# Patient Record
Sex: Female | Born: 1960 | Race: White | Hispanic: No | Marital: Married | Smoking: Former smoker
Health system: Southern US, Community
[De-identification: ages and names within clinical notes are randomized; demographics above are authoritative.]

## PROBLEM LIST (undated history)

## (undated) DIAGNOSIS — E119 Type 2 diabetes mellitus without complications: Secondary | ICD-10-CM

## (undated) DIAGNOSIS — I10 Essential (primary) hypertension: Secondary | ICD-10-CM

## (undated) DIAGNOSIS — Z950 Presence of cardiac pacemaker: Secondary | ICD-10-CM

## (undated) DIAGNOSIS — E785 Hyperlipidemia, unspecified: Secondary | ICD-10-CM

## (undated) DIAGNOSIS — I429 Cardiomyopathy, unspecified: Secondary | ICD-10-CM

## (undated) HISTORY — PX: GALLBLADDER SURGERY: SHX652

---

## 2011-11-25 ENCOUNTER — Ambulatory Visit: Payer: Self-pay | Admitting: Internal Medicine

## 2012-06-23 ENCOUNTER — Ambulatory Visit: Payer: Self-pay | Admitting: Family Medicine

## 2012-06-23 LAB — URINALYSIS, COMPLETE
Bilirubin,UR: NEGATIVE
Blood: NEGATIVE
Ketone: NEGATIVE
Leukocyte Esterase: NEGATIVE
Ph: 6 (ref 4.5–8.0)
Specific Gravity: 1.005 (ref 1.003–1.030)

## 2012-06-24 LAB — URINE CULTURE

## 2015-03-24 ENCOUNTER — Ambulatory Visit
Admission: EM | Admit: 2015-03-24 | Discharge: 2015-03-24 | Disposition: A | Discharge status: Home / Self Care | Payer: BLUE CROSS/BLUE SHIELD | Attending: Family Medicine | Admitting: Family Medicine

## 2015-03-24 ENCOUNTER — Encounter: Payer: Self-pay | Admitting: Emergency Medicine

## 2015-03-24 DIAGNOSIS — R05 Cough: Secondary | ICD-10-CM

## 2015-03-24 DIAGNOSIS — R059 Cough, unspecified: Secondary | ICD-10-CM

## 2015-03-24 DIAGNOSIS — J9801 Acute bronchospasm: Secondary | ICD-10-CM | POA: Diagnosis not present

## 2015-03-24 HISTORY — DX: Essential (primary) hypertension: I10

## 2015-03-24 HISTORY — DX: Hyperlipidemia, unspecified: E78.5

## 2015-03-24 HISTORY — DX: Type 2 diabetes mellitus without complications: E11.9

## 2015-03-24 MED ORDER — HYDROCOD POLST-CPM POLST ER 10-8 MG/5ML PO SUER: 5.0000 mL | Freq: Two times a day (BID) | ORAL | Status: DC

## 2015-03-24 MED ORDER — ALBUTEROL SULFATE HFA 108 (90 BASE) MCG/ACT IN AERS: 1.0000 | INHALATION_SPRAY | Freq: Four times a day (QID) | RESPIRATORY_TRACT | Status: DC | PRN

## 2015-03-24 NOTE — ED Notes (Addendum)
Cough for 1 month. Dry, no phlegm, Rib pain from coughing so much

## 2015-03-24 NOTE — ED Provider Notes (Signed)
CSN: [id number]     Arrival date & time 03/24/15  1304 History   First MD Initiated Contact with Patient 03/24/15 1343     Chief Complaint  Patient presents with  . Cough   (Consider location/radiation/quality/duration/timing/severity/associated sxs/prior Treatment) HPI Comments: 54 yo female with a 3 weeks h/o cough, mostly dry, non-productive. Initially cold symptoms with congestion, runny nose, sore throat which resolved. Denies fevers, chills, chest pains, shortness of breath. States at night has had some wheezing.   The history is provided by the patient.    Past Medical History  Diagnosis Date  . Diabetes mellitus without complication   . Hypertension   . Hyperlipemia    Past Surgical History  Procedure Laterality Date  . Gallbladder surgery    . Cesarean section     Family History  Problem Relation Age of Onset  . Diabetes Mother   . Hypertension Mother   . Diabetes Father   . Hypertension Father    History  Substance Use Topics  . Smoking status: Former Games developer  . Smokeless tobacco: Never Used  . Alcohol Use: Yes   OB History    No data available     Review of Systems  Allergies  Review of patient's allergies indicates no known allergies.  Home Medications   Prior to Admission medications   Medication Sig Start Date End Date Taking? Authorizing Provider  Alogliptin-Metformin HCl (KAZANO) 12.5-500 MG TABS Take 1 tablet by mouth 2 (two) times daily.   Yes Historical Provider, MD  irbesartan (AVAPRO) 150 MG tablet Take 150 mg by mouth daily.   Yes Historical Provider, MD  albuterol (PROVENTIL HFA;VENTOLIN HFA) 108 (90 BASE) MCG/ACT inhaler Inhale 1-2 puffs into the lungs every 6 (six) hours as needed for wheezing or shortness of breath. 03/24/15   Payton Mccallum, MD  chlorpheniramine-HYDROcodone (TUSSIONEX PENNKINETIC ER) 10-8 MG/5ML SUER Take 5 mLs by mouth 2 (two) times daily. 03/24/15   Payton Mccallum, MD   BP 155/81 mmHg  Pulse 97  Temp(Src) 97.3 F (36.3  C) (Oral)  Resp 16  Ht  (1.575 m)  Wt 160 lb (72.576 kg)  BMI 29.26 kg/m2  SpO2 100% Physical Exam  Constitutional: She appears well-developed and well-nourished. No distress.  HENT:  Head: Normocephalic.  Right Ear: Tympanic membrane, external ear and ear canal normal.  Left Ear: Tympanic membrane, external ear and ear canal normal.  Nose: Nose normal.  Mouth/Throat: Oropharynx is clear and moist and mucous membranes are normal.  Eyes: Conjunctivae and EOM are normal. Pupils are equal, round, and reactive to light. Right eye exhibits no discharge. Left eye exhibits no discharge. No scleral icterus.  Neck: Normal range of motion. Neck supple. No JVD present. No tracheal deviation present. No thyromegaly present.  Cardiovascular: Normal rate, regular rhythm, normal heart sounds and intact distal pulses.   No murmur heard. Pulmonary/Chest: Effort normal. No stridor. No respiratory distress. She has wheezes (few expiratory wheezes bilaterally). She has no rales.  Lymphadenopathy:    She has no cervical adenopathy.  Neurological: She is alert.  Skin: Skin is warm and dry. No rash noted. She is not diaphoretic.  Vitals reviewed.   ED Course  Procedures (including critical care time) Labs Review Labs Reviewed - No data to display  Imaging Review No results found.   MDM   1. Cough   2. Bronchospasm    Discharge Medication List as of 03/24/2015  2:05 PM    START taking these medications  Details  albuterol (PROVENTIL HFA;VENTOLIN HFA) 108 (90 BASE) MCG/ACT inhaler Inhale 1-2 puffs into the lungs every 6 (six) hours as needed for wheezing or shortness of breath., Starting 03/24/2015, Until Discontinued, Print    chlorpheniramine-HYDROcodone (TUSSIONEX PENNKINETIC ER) 10-8 MG/5ML SUER Take 5 mLs by mouth 2 (two) times daily., Starting 03/24/2015, Until Discontinued, Normal      Plan: 1. diagnosis reviewed with patient 2. rx as per orders; risks, benefits, potential side  effects reviewed with patient 3. Recommend supportive treatment with rest, increased fluids 4. F/u prn if symptoms worsen or don't improve    Payton Mccallum, MD 03/24/15 [phone]

## 2015-03-30 ENCOUNTER — Ambulatory Visit (INDEPENDENT_AMBULATORY_CARE_PROVIDER_SITE_OTHER): Payer: BLUE CROSS/BLUE SHIELD | Admitting: Internal Medicine

## 2015-03-30 ENCOUNTER — Encounter: Payer: Self-pay | Admitting: Internal Medicine

## 2015-03-30 VITALS — BP 130/81 | HR 102 | Temp 98.5°F | Resp 16 | Wt 160.4 lb

## 2015-03-30 DIAGNOSIS — A499 Bacterial infection, unspecified: Secondary | ICD-10-CM | POA: Diagnosis not present

## 2015-03-30 DIAGNOSIS — F419 Anxiety disorder, unspecified: Secondary | ICD-10-CM | POA: Insufficient documentation

## 2015-03-30 DIAGNOSIS — I10 Essential (primary) hypertension: Secondary | ICD-10-CM | POA: Insufficient documentation

## 2015-03-30 DIAGNOSIS — E118 Type 2 diabetes mellitus with unspecified complications: Secondary | ICD-10-CM | POA: Insufficient documentation

## 2015-03-30 DIAGNOSIS — B9689 Other specified bacterial agents as the cause of diseases classified elsewhere: Secondary | ICD-10-CM

## 2015-03-30 DIAGNOSIS — E785 Hyperlipidemia, unspecified: Secondary | ICD-10-CM

## 2015-03-30 DIAGNOSIS — E1169 Type 2 diabetes mellitus with other specified complication: Secondary | ICD-10-CM | POA: Insufficient documentation

## 2015-03-30 DIAGNOSIS — E119 Type 2 diabetes mellitus without complications: Secondary | ICD-10-CM | POA: Insufficient documentation

## 2015-03-30 DIAGNOSIS — J329 Chronic sinusitis, unspecified: Secondary | ICD-10-CM

## 2015-03-30 DIAGNOSIS — Z794 Long term (current) use of insulin: Secondary | ICD-10-CM

## 2015-03-30 MED ORDER — AMOXICILLIN 500 MG PO CAPS: 500.0000 mg | ORAL_CAPSULE | Freq: Three times a day (TID) | ORAL | Status: DC

## 2015-03-30 NOTE — Patient Instructions (Signed)
Get  Allegra 180 mg ( fexofenadine) or Claritin 10 mg ( loratidine) and take once a day. Delsym cough syrup is good over the counter

## 2015-03-30 NOTE — Progress Notes (Signed)
Date:  03/30/2015   Name:  Lori Brewer   DOB:  04-Apr-1961   MRN:  [id number]   Chief Complaint: Sinus pressure and Cough Cough This is a chronic problem. The current episode started 1 to 4 weeks ago. The problem has been unchanged. The problem occurs every few minutes. The cough is non-productive. Associated symptoms include ear congestion, headaches and nasal congestion. Pertinent negatives include no chills, fever, postnasal drip, sore throat, shortness of breath or wheezing. The symptoms are aggravated by exercise and lying down. Treatments tried: advil cold and sinus. The treatment provided mild relief.     Review of Systems:  Review of Systems  Constitutional: Negative for fever, chills and fatigue.  HENT: Negative for postnasal drip, sore throat, trouble swallowing and voice change.   Respiratory: Positive for cough. Negative for chest tightness, shortness of breath and wheezing.   Neurological: Positive for headaches. Negative for dizziness, syncope and light-headedness.    Patient Active Problem List   Diagnosis Date Noted  . Anxiety, mild 03/30/2015  . Controlled diabetes mellitus type II without complication 03/30/2015  . Essential (primary) hypertension 03/30/2015  . Combined fat and carbohydrate induced hyperlipemia 03/30/2015    Prior to Admission medications   Medication Sig Start Date End Date Taking? Authorizing Provider  albuterol (PROVENTIL HFA;VENTOLIN HFA) 108 (90 BASE) MCG/ACT inhaler Inhale 1-2 puffs into the lungs every 6 (six) hours as needed for wheezing or shortness of breath. 03/24/15  Yes Payton Mccallum, MD  Alogliptin-Metformin HCl (KAZANO) 12.5-500 MG TABS Take 1 tablet by mouth 2 (two) times daily.   Yes Historical Provider, MD  atorvastatin (LIPITOR) 20 MG tablet Take 1 tablet by mouth daily at 2 PM daily at 2 PM. 01/11/15  Yes Historical Provider, MD  chlorpheniramine-HYDROcodone (TUSSIONEX PENNKINETIC ER) 10-8 MG/5ML SUER Take 5 mLs by mouth 2 (two) times  daily. 03/24/15  Yes Payton Mccallum, MD  glucose blood test strip  06/26/14  Yes Historical Provider, MD  irbesartan (AVAPRO) 150 MG tablet Take 150 mg by mouth daily.   Yes Historical Provider, MD    No Known Allergies  Past Surgical History  Procedure Laterality Date  . Gallbladder surgery    . Cesarean section      History  Substance Use Topics  . Smoking status: Never Smoker   . Smokeless tobacco: Never Used  . Alcohol Use: 0.0 oz/week    0 Standard drinks or equivalent per week     Medication list has been reviewed and updated.  Physical Examination:  Physical Exam  Constitutional: She appears well-developed and well-nourished.  HENT:  Right Ear: Tympanic membrane and ear canal normal.  Left Ear: Tympanic membrane and ear canal normal.  Nose: Right sinus exhibits maxillary sinus tenderness. Left sinus exhibits maxillary sinus tenderness.  Mouth/Throat: Oropharynx is clear and moist.  Neck: Normal range of motion. Neck supple. No thyromegaly present.  Cardiovascular: Normal rate, regular rhythm and normal heart sounds.   Pulmonary/Chest: Effort normal and breath sounds normal. No respiratory distress. She has no decreased breath sounds. She has no wheezes.  Lymphadenopathy:    She has no cervical adenopathy.    BP 130/81 mmHg  Pulse 102  Temp(Src) 98.5 F (36.9 C) (Oral)  Resp 16  Wt 160 lb 6.4 oz (72.757 kg)  SpO2 99% Assessment and Plan: 1. Sinusitis, bacterial Begin Allegra or clartin for post nasal drainage Continue inhaler if needed - amoxicillin (AMOXIL) 500 MG capsule; Take 1 capsule (500 mg total) by mouth 3 (  three) times daily.  Dispense: 30 capsule; Refill: 0   Bari Edward, MD [facility]  03/30/2015

## 2015-04-02 ENCOUNTER — Encounter: Payer: Self-pay | Admitting: Internal Medicine

## 2015-04-02 ENCOUNTER — Telehealth: Payer: Self-pay

## 2015-04-02 NOTE — Telephone Encounter (Signed)
Patient informed to finish antibiotic and let us know how she feels then.dr

## 2015-04-05 ENCOUNTER — Encounter: Payer: Self-pay | Admitting: Internal Medicine

## 2015-04-09 ENCOUNTER — Encounter: Payer: Self-pay | Admitting: Internal Medicine

## 2015-04-16 ENCOUNTER — Ambulatory Visit: Payer: Self-pay | Admitting: Internal Medicine

## 2015-04-16 ENCOUNTER — Encounter: Payer: Self-pay | Admitting: Family Medicine

## 2015-04-16 ENCOUNTER — Ambulatory Visit (INDEPENDENT_AMBULATORY_CARE_PROVIDER_SITE_OTHER): Payer: BLUE CROSS/BLUE SHIELD | Admitting: Family Medicine

## 2015-04-16 VITALS — BP 140/100 | HR 72 | Ht 62.0 in | Wt 161.0 lb

## 2015-04-16 DIAGNOSIS — A499 Bacterial infection, unspecified: Secondary | ICD-10-CM

## 2015-04-16 DIAGNOSIS — E119 Type 2 diabetes mellitus without complications: Secondary | ICD-10-CM

## 2015-04-16 DIAGNOSIS — J329 Chronic sinusitis, unspecified: Secondary | ICD-10-CM | POA: Diagnosis not present

## 2015-04-16 DIAGNOSIS — B9689 Other specified bacterial agents as the cause of diseases classified elsewhere: Secondary | ICD-10-CM

## 2015-04-16 MED ORDER — METFORMIN HCL 500 MG PO TABS: 500.0000 mg | ORAL_TABLET | Freq: Two times a day (BID) | ORAL | Status: DC

## 2015-04-16 MED ORDER — AZITHROMYCIN 250 MG PO TABS: ORAL_TABLET | ORAL | Status: DC

## 2015-04-16 MED ORDER — GUAIFENESIN-CODEINE 100-10 MG/5ML PO SOLN: 5.0000 mL | Freq: Three times a day (TID) | ORAL | Status: DC | PRN

## 2015-04-16 NOTE — Progress Notes (Signed)
Name: Lori Brewer   MRN: [id number]    DOB: 1961-09-06   Date:04/16/2015       Progress Note  Subjective  Chief Complaint  Chief Complaint  Patient presents with  . Diabetes  . Sinusitis    completed course of Amoxil- 2 weeks ago    Diabetes She presents for her follow-up diabetic visit. She has type 2 diabetes mellitus. Her disease course has been fluctuating. There are no hypoglycemic associated symptoms. Pertinent negatives for hypoglycemia include no dizziness, headaches or nervousness/anxiousness. Pertinent negatives for diabetes include no blurred vision, no chest pain, no fatigue, no foot paresthesias, no foot ulcerations, no polydipsia, no polyphagia, no polyuria, no visual change, no weakness and no weight loss. There are no hypoglycemic complications. Symptoms are worsening. Pertinent negatives for diabetic complications include no autonomic neuropathy, CVA, heart disease, nephropathy, peripheral neuropathy, PVD or retinopathy. Risk factors for coronary artery disease include diabetes mellitus. Current diabetic treatment includes oral agent (monotherapy) and oral agent (dual therapy). She is compliant with treatment all of the time. Her home blood glucose trend is increasing steadily. Her breakfast blood glucose range is generally 180-200 mg/dl.  Sinusitis This is a recurrent problem. The current episode started more than 1 month ago. The problem has been gradually improving since onset. There has been no fever. She is experiencing no pain. Associated symptoms include chills, coughing, shortness of breath and sinus pressure. Pertinent negatives include no congestion, diaphoresis, ear pain, headaches, neck pain, sore throat or swollen glands.    No problem-specific assessment & plan notes found for this encounter.   Past Medical History  Diagnosis Date  . Diabetes mellitus without complication   . Hypertension   . Hyperlipemia     Past Surgical History  Procedure Laterality Date   . Gallbladder surgery    . Cesarean section      Family History  Problem Relation Age of Onset  . Diabetes Mother   . Hypertension Mother   . Diabetes Father   . Hypertension Father     History   Social History  . Marital Status: Married    Spouse Name: N/A  . Number of Children: N/A  . Years of Education: N/A   Occupational History  . Not on file.   Social History Main Topics  . Smoking status: Former Games developer  . Smokeless tobacco: Never Used  . Alcohol Use: 0.0 oz/week    0 Standard drinks or equivalent per week  . Drug Use: No  . Sexual Activity: Yes   Other Topics Concern  . Not on file   Social History Narrative    No Known Allergies   Review of Systems  Constitutional: Positive for chills. Negative for fever, weight loss, malaise/fatigue, diaphoresis and fatigue.  HENT: Positive for sinus pressure. Negative for congestion, ear discharge, ear pain and sore throat.   Eyes: Negative for blurred vision.  Respiratory: Positive for cough and shortness of breath. Negative for sputum production and wheezing.   Cardiovascular: Negative for chest pain, palpitations and leg swelling.  Gastrointestinal: Negative for heartburn, nausea, abdominal pain, diarrhea, constipation, blood in stool and melena.  Genitourinary: Negative for dysuria, urgency, frequency and hematuria.  Musculoskeletal: Negative for myalgias, back pain, joint pain and neck pain.  Skin: Negative for rash.  Neurological: Negative for dizziness, tingling, sensory change, focal weakness, weakness and headaches.  Endo/Heme/Allergies: Negative for environmental allergies, polydipsia and polyphagia. Does not bruise/bleed easily.  Psychiatric/Behavioral: Negative for depression and suicidal ideas. The patient is  not nervous/anxious and does not have insomnia.      Objective  Filed Vitals:   04/16/15 0759  BP: 140/100  Pulse: 72  Height:  (1.575 m)  Weight: 161 lb (73.029 kg)    Physical Exam   Constitutional: She is well-developed, well-nourished, and in no distress. No distress.  HENT:  Head: Normocephalic and atraumatic.  Right Ear: External ear normal.  Left Ear: External ear normal.  Nose: Nose normal.  Mouth/Throat: Oropharynx is clear and moist.  Eyes: Conjunctivae and EOM are normal. Pupils are equal, round, and reactive to light. Right eye exhibits no discharge. Left eye exhibits no discharge.  Neck: Normal range of motion. Neck supple. No JVD present. No thyromegaly present.  Cardiovascular: Normal rate, regular rhythm, normal heart sounds and intact distal pulses.  Exam reveals no gallop and no friction rub.   No murmur heard. Pulmonary/Chest: Effort normal and breath sounds normal.  Abdominal: Soft. Bowel sounds are normal. She exhibits no mass. There is no tenderness. There is no guarding.  Musculoskeletal: Normal range of motion. She exhibits no edema.  Lymphadenopathy:    She has no cervical adenopathy.  Neurological: She is alert. She has normal reflexes.  Skin: Skin is warm and dry. She is not diaphoretic.  Psychiatric: Mood and affect normal.      Assessment & Plan  Problem List Items Addressed This Visit      Respiratory   Sinusitis, bacterial   Relevant Medications   azithromycin (ZITHROMAX) 250 MG tablet   guaiFENesin-codeine 100-10 MG/5ML syrup     Endocrine   Controlled diabetes mellitus type II without complication - Primary   Relevant Medications   Alogliptin Benzoate (NESINA) 25 MG TABS   metFORMIN (GLUCOPHAGE) 500 MG tablet        Dr. [facility]  04/16/2015

## 2015-05-15 ENCOUNTER — Ambulatory Visit (INDEPENDENT_AMBULATORY_CARE_PROVIDER_SITE_OTHER): Payer: BLUE CROSS/BLUE SHIELD | Admitting: Internal Medicine

## 2015-05-15 ENCOUNTER — Other Ambulatory Visit: Payer: Self-pay | Admitting: Internal Medicine

## 2015-05-15 ENCOUNTER — Encounter: Payer: Self-pay | Admitting: Internal Medicine

## 2015-05-15 VITALS — BP 152/78 | HR 96 | Ht 62.0 in | Wt 161.4 lb

## 2015-05-15 DIAGNOSIS — Z1239 Encounter for other screening for malignant neoplasm of breast: Secondary | ICD-10-CM

## 2015-05-15 DIAGNOSIS — Z780 Asymptomatic menopausal state: Secondary | ICD-10-CM | POA: Diagnosis not present

## 2015-05-15 DIAGNOSIS — I10 Essential (primary) hypertension: Secondary | ICD-10-CM | POA: Diagnosis not present

## 2015-05-15 DIAGNOSIS — A499 Bacterial infection, unspecified: Secondary | ICD-10-CM

## 2015-05-15 DIAGNOSIS — B9689 Other specified bacterial agents as the cause of diseases classified elsewhere: Secondary | ICD-10-CM

## 2015-05-15 DIAGNOSIS — E119 Type 2 diabetes mellitus without complications: Secondary | ICD-10-CM

## 2015-05-15 DIAGNOSIS — J329 Chronic sinusitis, unspecified: Secondary | ICD-10-CM

## 2015-05-15 MED ORDER — ESTRADIOL 0.5 MG PO TABS: 0.5000 mg | ORAL_TABLET | Freq: Every day | ORAL | Status: DC

## 2015-05-15 MED ORDER — MEDROXYPROGESTERONE ACETATE 2.5 MG PO TABS: 2.5000 mg | ORAL_TABLET | Freq: Every day | ORAL | Status: DC

## 2015-05-15 MED ORDER — ALOGLIPTIN-METFORMIN HCL 12.5-1000 MG PO TABS: 1.0000 | ORAL_TABLET | Freq: Two times a day (BID) | ORAL | Status: DC

## 2015-05-15 NOTE — Progress Notes (Signed)
Date:  05/15/2015   Name:  Lori Brewer   DOB:  September 21, 1960   MRN:  [id number]   Chief Complaint: Diabetes Diabetes She presents for her follow-up (Last A1c in December was 8.6.) diabetic visit. She has type 2 diabetes mellitus. Her disease course has been stable. Pertinent negatives for diabetes include no chest pain, no fatigue and no polyuria. Symptoms are worsening (Last visit her medications were changed from kazano to nesina 25 mg and metformin decreased to 500 mg once a day.). Her weight is stable. Her breakfast blood glucose is taken between 6-7 am. Her breakfast blood glucose range is generally 180-200 mg/dl. An ACE inhibitor/angiotensin II receptor blocker is being taken.  Shortness of Breath The current episode started 1 to 4 weeks ago. The problem occurs every several days. The problem has been gradually improving (She was treated about 3 weeks ago with a Z-Pak and cough syrup for bronchitis. She feels like she is slowly improving but still gets short of breath with exertion such as climbing stairs. She has an albuterol inhaler which does help significantly. She has ). Pertinent negatives include no chest pain or sore throat. The symptoms are aggravated by exercise.  Hypertension This is a chronic (No chest pains, but recently used albuterol inhaler for shortness of breath. Generally blood pressures are well controlled.) problem. The current episode started more than 1 month ago. The problem is unchanged. The problem is controlled. Associated symptoms include shortness of breath. Pertinent negatives include no chest pain.   MENOPAUSE SX Patient reports over 1 year since her last menses. Is now having significant hot flashes and sweats especially at night interrupting her sleep multiple times. She's tried many over-the-counter preparations such as soy and black cohosh and evening primrose oil all without benefit. She is very if should hormone replacement therapy. There is no family history of  breast cancer she is a nonsmoker.  Review of Systems:  Review of Systems  Constitutional: Negative for chills, diaphoresis and fatigue.  HENT: Positive for sinus pressure. Negative for sore throat and trouble swallowing.   Respiratory: Positive for cough and shortness of breath. Negative for apnea and chest tightness.   Cardiovascular: Negative for chest pain.  Endocrine: Negative for polyuria.  Genitourinary: Negative for urgency, frequency and vaginal bleeding.  Musculoskeletal: Negative for back pain and arthralgias.  Psychiatric/Behavioral: Positive for sleep disturbance. Negative for dysphoric mood.    Patient Active Problem List   Diagnosis Date Noted  . Anxiety, mild 03/30/2015  . Controlled diabetes mellitus type II without complication 03/30/2015  . Essential (primary) hypertension 03/30/2015  . Combined fat and carbohydrate induced hyperlipemia 03/30/2015  . Sinusitis, bacterial 03/30/2015    Prior to Admission medications   Medication Sig Start Date End Date Taking? Authorizing Provider  albuterol (PROVENTIL HFA;VENTOLIN HFA) 108 (90 BASE) MCG/ACT inhaler Inhale 1-2 puffs into the lungs every 6 (six) hours as needed for wheezing or shortness of breath. 03/24/15  Yes Payton Mccallum, MD  Alogliptin Benzoate (NESINA) 25 MG TABS Take 1 tablet by mouth daily at 6 (six) AM.   Yes Historical Provider, MD  atorvastatin (LIPITOR) 20 MG tablet Take 1 tablet by mouth daily at 2 PM daily at 2 PM. 01/11/15  Yes Historical Provider, MD  glucose blood test strip  06/26/14  Yes Historical Provider, MD  irbesartan (AVAPRO) 150 MG tablet Take 150 mg by mouth daily.   Yes Historical Provider, MD  metFORMIN (GLUCOPHAGE) 500 MG tablet Take 1 tablet (500 mg total)  by mouth 2 (two) times daily with a meal. 04/16/15  Yes Duanne Limerick, MD    No Known Allergies  Past Surgical History  Procedure Laterality Date  . Gallbladder surgery    . Cesarean section      Social History  Substance Use  Topics  . Smoking status: Former Games developer  . Smokeless tobacco: Never Used  . Alcohol Use: 0.0 oz/week    0 Standard drinks or equivalent per week     Medication list has been reviewed and updated.  Physical Examination:  Physical Exam  Constitutional: She is oriented to person, place, and time. She appears well-developed. No distress.  HENT:  Head: Normocephalic and atraumatic.  Eyes: Conjunctivae are normal. Right eye exhibits no discharge. Left eye exhibits no discharge. No scleral icterus.  Neck: Neck supple. No thyromegaly present.  Cardiovascular: Normal rate, regular rhythm and normal heart sounds.   Pulmonary/Chest: Effort normal and breath sounds normal. No respiratory distress. She has no wheezes. She has no rales.  Musculoskeletal: Normal range of motion. She exhibits no edema.  Lymphadenopathy:    She has no cervical adenopathy.  Neurological: She is alert and oriented to person, place, and time.  Skin: Skin is warm and dry. No rash noted.  Psychiatric: She has a normal mood and affect. Her behavior is normal. Thought content normal.    BP 152/78 mmHg  Pulse 96  Ht 5\' 2"  (1.575 m)  Wt 161 lb 6.4 oz (73.211 kg)  BMI 29.51 kg/m2  Assessment and Plan: 1. Controlled diabetes mellitus type II without complication Needs additional control. Will resume kAZANO and add an additional metformin at bedtime - Alogliptin-Metformin HCl 12.01-999 MG TABS; Take 1 tablet by mouth 2 (two) times daily.  Dispense: 180 tablet; Refill: 1 - Hemoglobin A1c  2. Essential (primary) hypertension Generally controlled continue current regimen  3. Sinusitis, bacterial Essentially resolved. Some residual shortness of breath from bronchitis. Use inhaler prior to activity. If symptoms persist another 4 weeks return for further evaluation  4. Menopause WIll began HRT tapeR to lowest effective dose. - estradiol (ESTRACE) 0.5 MG tablet; Take 1 tablet (0.5 mg total) by mouth daily.  Dispense: 30  tablet; Refill: 1 - medroxyPROGESTERone (PROVERA) 2.5 MG tablet; Take 1 tablet (2.5 mg total) by mouth daily.  Dispense: 30 tablet; Refill: 1  5. Breast cancer screening - MM DIGITAL SCREENING BILATERAL; Future   Bari Edward, MD [facility]  05/15/2015

## 2015-05-16 ENCOUNTER — Encounter: Payer: Self-pay | Admitting: Internal Medicine

## 2015-05-16 LAB — HEMOGLOBIN A1C
ESTIMATED AVERAGE GLUCOSE: 243 mg/dL
HEMOGLOBIN A1C: 10.1 % — AB (ref 4.8–5.6)

## 2015-05-17 ENCOUNTER — Other Ambulatory Visit: Payer: Self-pay | Admitting: Internal Medicine

## 2015-05-17 ENCOUNTER — Ambulatory Visit
Admission: RE | Admit: 2015-05-17 | Discharge: 2015-05-17 | Disposition: A | Discharge status: Home / Self Care | Payer: BLUE CROSS/BLUE SHIELD | Source: Ambulatory Visit | Attending: Internal Medicine | Admitting: Internal Medicine

## 2015-05-17 DIAGNOSIS — E119 Type 2 diabetes mellitus without complications: Secondary | ICD-10-CM

## 2015-05-17 DIAGNOSIS — Z1231 Encounter for screening mammogram for malignant neoplasm of breast: Secondary | ICD-10-CM | POA: Insufficient documentation

## 2015-05-17 DIAGNOSIS — Z1239 Encounter for other screening for malignant neoplasm of breast: Secondary | ICD-10-CM

## 2015-05-17 MED ORDER — ALOGLIPTIN-METFORMIN HCL 12.5-500 MG PO TABS: 1.0000 | ORAL_TABLET | Freq: Two times a day (BID) | ORAL | Status: DC

## 2015-05-31 ENCOUNTER — Encounter: Payer: Self-pay | Admitting: Internal Medicine

## 2015-06-01 ENCOUNTER — Encounter: Payer: Self-pay | Admitting: Internal Medicine

## 2015-06-01 ENCOUNTER — Ambulatory Visit (INDEPENDENT_AMBULATORY_CARE_PROVIDER_SITE_OTHER): Payer: BLUE CROSS/BLUE SHIELD | Admitting: Internal Medicine

## 2015-06-01 ENCOUNTER — Ambulatory Visit
Admission: RE | Admit: 2015-06-01 | Discharge: 2015-06-01 | Disposition: A | Discharge status: Home / Self Care | Payer: BLUE CROSS/BLUE SHIELD | Source: Ambulatory Visit | Attending: Internal Medicine | Admitting: Internal Medicine

## 2015-06-01 VITALS — BP 120/60 | HR 104 | Ht 62.0 in | Wt 164.0 lb

## 2015-06-01 DIAGNOSIS — R918 Other nonspecific abnormal finding of lung field: Secondary | ICD-10-CM | POA: Insufficient documentation

## 2015-06-01 DIAGNOSIS — R062 Wheezing: Secondary | ICD-10-CM | POA: Diagnosis not present

## 2015-06-01 DIAGNOSIS — R0602 Shortness of breath: Secondary | ICD-10-CM | POA: Diagnosis present

## 2015-06-01 DIAGNOSIS — B9689 Other specified bacterial agents as the cause of diseases classified elsewhere: Secondary | ICD-10-CM

## 2015-06-01 DIAGNOSIS — J329 Chronic sinusitis, unspecified: Secondary | ICD-10-CM | POA: Diagnosis not present

## 2015-06-01 DIAGNOSIS — A499 Bacterial infection, unspecified: Secondary | ICD-10-CM | POA: Diagnosis not present

## 2015-06-01 DIAGNOSIS — I517 Cardiomegaly: Secondary | ICD-10-CM | POA: Diagnosis not present

## 2015-06-01 NOTE — Progress Notes (Signed)
Date:  06/01/2015   Name:  Lori Brewer   DOB:  [birth year]   MRN:  [id number]   Chief Complaint: Shortness of Breath Shortness of Breath This is a new problem. The current episode started 1 to 4 weeks ago. The problem occurs daily. The problem has been unchanged. Associated symptoms include chest pain and wheezing. Pertinent negatives include no abdominal pain or fever. The symptoms are aggravated by any activity and lying flat. She has tried beta agonist inhalers for the symptoms. The treatment provided mild relief. Her past medical history is significant for allergies and asthma.  Diabetes She presents for her follow-up diabetic visit. She has type 2 diabetes mellitus. Associated symptoms include chest pain. Pertinent negatives for diabetes include no fatigue.   She's back on Kazano.   Review of Systems:  Review of Systems  Constitutional: Negative for fever, diaphoresis and fatigue.  Respiratory: Positive for chest tightness, shortness of breath and wheezing. Negative for cough and choking.   Cardiovascular: Positive for chest pain. Negative for palpitations.  Gastrointestinal: Negative for abdominal pain.  Neurological: Negative for syncope and light-headedness.  Hematological: Negative for adenopathy.    Patient Active Problem List   Diagnosis Date Noted  . Menopause 05/15/2015  . Anxiety, mild 03/30/2015  . Controlled diabetes mellitus type II without complication 03/30/2015  . Essential (primary) hypertension 03/30/2015  . Combined fat and carbohydrate induced hyperlipemia 03/30/2015  . Sinusitis, bacterial 03/30/2015    Prior to Admission medications   Medication Sig Start Date End Date Taking? Authorizing Provider  albuterol (PROVENTIL HFA;VENTOLIN HFA) 108 (90 BASE) MCG/ACT inhaler Inhale 1-2 puffs into the lungs every 6 (six) hours as needed for wheezing or shortness of breath. 03/24/15  Yes Payton Mccallum, MD  Alogliptin-Metformin HCl 12.5-500 MG TABS  05/17/15  Yes  Historical Provider, MD  atorvastatin (LIPITOR) 20 MG tablet Take 1 tablet by mouth daily at 2 PM daily at 2 PM. 01/11/15  Yes Historical Provider, MD  estradiol (ESTRACE) 0.5 MG tablet Take 1 tablet (0.5 mg total) by mouth daily. 05/15/15  Yes Reubin Milan, MD  glucose blood test strip  06/26/14  Yes Historical Provider, MD  irbesartan (AVAPRO) 300 MG tablet Take 1 tablet by mouth daily at 2 PM daily at 2 PM. 05/15/15  Yes Historical Provider, MD  medroxyPROGESTERone (PROVERA) 2.5 MG tablet Take 1 tablet (2.5 mg total) by mouth daily. 05/15/15  Yes Reubin Milan, MD  metFORMIN (GLUCOPHAGE) 500 MG tablet Take 1 tablet (500 mg total) by mouth 2 (two) times daily with a meal. 04/16/15  Yes Duanne Limerick, MD    No Known Allergies  Past Surgical History  Procedure Laterality Date  . Gallbladder surgery    . Cesarean section      Social History  Substance Use Topics  . Smoking status: Former Games developer  . Smokeless tobacco: Never Used  . Alcohol Use: 0.0 oz/week    0 Standard drinks or equivalent per week     Medication list has been reviewed and updated.  Physical Examination:  Physical Exam  Constitutional: She is oriented to person, place, and time. She appears well-developed and well-nourished.  Neck: Normal range of motion. Neck supple. No thyromegaly present.  Cardiovascular: Normal rate, regular rhythm and normal heart sounds.   Pulmonary/Chest: Effort normal. She has decreased breath sounds in the right lower field and the left lower field. She has no wheezes.  Musculoskeletal: She exhibits no edema or tenderness.  Neurological: She is alert  and oriented to person, place, and time.  Skin: Skin is warm and dry.    BP 120/60 mmHg  Pulse 104  Ht  (1.575 m)  Wt 164 lb (74.39 kg)  BMI 29.99 kg/m2  SpO2 100%  Assessment and Plan: 1. Wheezing We'll add Advair 100/50 one puff twice a day samples are given - DG Chest 2 View; Future - Pulmonary function test; Future  2.  Sinusitis, bacterial Resolved; no further antibiotics are needed   Bari Edward, MD [facility]  06/01/2015

## 2015-06-02 ENCOUNTER — Other Ambulatory Visit: Payer: Self-pay | Admitting: Internal Medicine

## 2015-06-02 DIAGNOSIS — R059 Cough, unspecified: Secondary | ICD-10-CM

## 2015-06-02 DIAGNOSIS — R9389 Abnormal findings on diagnostic imaging of other specified body structures: Secondary | ICD-10-CM

## 2015-06-02 DIAGNOSIS — R0609 Other forms of dyspnea: Secondary | ICD-10-CM

## 2015-06-02 DIAGNOSIS — R05 Cough: Secondary | ICD-10-CM

## 2015-06-04 ENCOUNTER — Encounter: Payer: Self-pay | Admitting: Internal Medicine

## 2015-06-05 ENCOUNTER — Other Ambulatory Visit: Payer: Self-pay | Admitting: Internal Medicine

## 2015-06-05 DIAGNOSIS — J45909 Unspecified asthma, uncomplicated: Secondary | ICD-10-CM

## 2015-06-07 ENCOUNTER — Ambulatory Visit: Payer: BLUE CROSS/BLUE SHIELD | Attending: Internal Medicine

## 2015-06-07 DIAGNOSIS — J45909 Unspecified asthma, uncomplicated: Secondary | ICD-10-CM

## 2015-06-07 DIAGNOSIS — R062 Wheezing: Secondary | ICD-10-CM

## 2015-06-19 ENCOUNTER — Other Ambulatory Visit: Payer: Self-pay | Admitting: Specialist

## 2015-06-19 DIAGNOSIS — J849 Interstitial pulmonary disease, unspecified: Secondary | ICD-10-CM

## 2015-06-19 DIAGNOSIS — J9801 Acute bronchospasm: Secondary | ICD-10-CM | POA: Insufficient documentation

## 2015-06-19 DIAGNOSIS — J984 Other disorders of lung: Secondary | ICD-10-CM | POA: Insufficient documentation

## 2015-06-22 ENCOUNTER — Ambulatory Visit
Admission: RE | Admit: 2015-06-22 | Discharge: 2015-06-22 | Disposition: A | Discharge status: Home / Self Care | Payer: BLUE CROSS/BLUE SHIELD | Source: Ambulatory Visit | Attending: Specialist | Admitting: Specialist

## 2015-06-22 ENCOUNTER — Other Ambulatory Visit: Payer: Self-pay | Admitting: Specialist

## 2015-06-22 DIAGNOSIS — Z8701 Personal history of pneumonia (recurrent): Secondary | ICD-10-CM | POA: Diagnosis not present

## 2015-06-22 DIAGNOSIS — I517 Cardiomegaly: Secondary | ICD-10-CM | POA: Diagnosis not present

## 2015-06-22 DIAGNOSIS — J849 Interstitial pulmonary disease, unspecified: Secondary | ICD-10-CM

## 2015-06-22 DIAGNOSIS — R0602 Shortness of breath: Secondary | ICD-10-CM | POA: Insufficient documentation

## 2015-06-22 DIAGNOSIS — I2510 Atherosclerotic heart disease of native coronary artery without angina pectoris: Secondary | ICD-10-CM | POA: Diagnosis not present

## 2015-06-26 ENCOUNTER — Other Ambulatory Visit: Payer: Self-pay | Admitting: Internal Medicine

## 2015-06-26 DIAGNOSIS — IMO0001 Reserved for inherently not codable concepts without codable children: Secondary | ICD-10-CM

## 2015-07-12 ENCOUNTER — Ambulatory Visit
Admission: RE | Admit: 2015-07-12 | Discharge: 2015-07-12 | Disposition: A | Discharge status: Home / Self Care | Payer: BLUE CROSS/BLUE SHIELD | Source: Ambulatory Visit | Attending: Internal Medicine | Admitting: Internal Medicine

## 2015-07-12 ENCOUNTER — Encounter
Admission: RE | Disposition: A | Discharge status: Home / Self Care | Payer: Self-pay | Source: Ambulatory Visit | Attending: Internal Medicine

## 2015-07-12 DIAGNOSIS — Z87891 Personal history of nicotine dependence: Secondary | ICD-10-CM | POA: Diagnosis not present

## 2015-07-12 DIAGNOSIS — Z7951 Long term (current) use of inhaled steroids: Secondary | ICD-10-CM | POA: Diagnosis not present

## 2015-07-12 DIAGNOSIS — I272 Other secondary pulmonary hypertension: Secondary | ICD-10-CM | POA: Diagnosis not present

## 2015-07-12 DIAGNOSIS — Z79899 Other long term (current) drug therapy: Secondary | ICD-10-CM | POA: Diagnosis not present

## 2015-07-12 DIAGNOSIS — Z8261 Family history of arthritis: Secondary | ICD-10-CM | POA: Diagnosis not present

## 2015-07-12 DIAGNOSIS — E119 Type 2 diabetes mellitus without complications: Secondary | ICD-10-CM | POA: Diagnosis not present

## 2015-07-12 DIAGNOSIS — Z8249 Family history of ischemic heart disease and other diseases of the circulatory system: Secondary | ICD-10-CM | POA: Insufficient documentation

## 2015-07-12 DIAGNOSIS — Z7984 Long term (current) use of oral hypoglycemic drugs: Secondary | ICD-10-CM | POA: Insufficient documentation

## 2015-07-12 DIAGNOSIS — R079 Chest pain, unspecified: Secondary | ICD-10-CM | POA: Insufficient documentation

## 2015-07-12 DIAGNOSIS — R0602 Shortness of breath: Secondary | ICD-10-CM | POA: Diagnosis present

## 2015-07-12 DIAGNOSIS — Z6829 Body mass index (BMI) 29.0-29.9, adult: Secondary | ICD-10-CM | POA: Diagnosis not present

## 2015-07-12 DIAGNOSIS — Z820 Family history of epilepsy and other diseases of the nervous system: Secondary | ICD-10-CM | POA: Insufficient documentation

## 2015-07-12 DIAGNOSIS — E785 Hyperlipidemia, unspecified: Secondary | ICD-10-CM | POA: Insufficient documentation

## 2015-07-12 DIAGNOSIS — J45909 Unspecified asthma, uncomplicated: Secondary | ICD-10-CM | POA: Insufficient documentation

## 2015-07-12 DIAGNOSIS — I4891 Unspecified atrial fibrillation: Secondary | ICD-10-CM | POA: Insufficient documentation

## 2015-07-12 DIAGNOSIS — I429 Cardiomyopathy, unspecified: Secondary | ICD-10-CM | POA: Diagnosis not present

## 2015-07-12 DIAGNOSIS — E669 Obesity, unspecified: Secondary | ICD-10-CM | POA: Insufficient documentation

## 2015-07-12 DIAGNOSIS — Z9049 Acquired absence of other specified parts of digestive tract: Secondary | ICD-10-CM | POA: Insufficient documentation

## 2015-07-12 HISTORY — PX: CARDIAC CATHETERIZATION: SHX172

## 2015-07-12 SURGERY — RIGHT AND LEFT HEART CATH
Anesthesia: Moderate Sedation

## 2015-07-12 MED ORDER — HEPARIN (PORCINE) IN NACL 2-0.9 UNIT/ML-% IJ SOLN
INTRAMUSCULAR | Status: AC
  Filled 2015-07-12: qty 1000

## 2015-07-12 MED ORDER — SODIUM CHLORIDE 0.9 % IJ SOLN: 3.0000 mL | INTRAMUSCULAR | Status: DC | PRN

## 2015-07-12 MED ORDER — MIDAZOLAM HCL 2 MG/2ML IJ SOLN
INTRAMUSCULAR | Status: AC
  Filled 2015-07-12: qty 2

## 2015-07-12 MED ORDER — ACETAMINOPHEN 325 MG PO TABS: 650.0000 mg | ORAL_TABLET | ORAL | Status: DC | PRN

## 2015-07-12 MED ORDER — SODIUM CHLORIDE 0.9 % IV SOLN: 250.0000 mL | INTRAVENOUS | Status: DC | PRN

## 2015-07-12 MED ORDER — SODIUM CHLORIDE 0.9 % WEIGHT BASED INFUSION: 1.0000 mL/kg/h | INTRAVENOUS | Status: DC

## 2015-07-12 MED ORDER — IOHEXOL 300 MG/ML  SOLN
INTRAMUSCULAR | Status: DC | PRN
  Administered 2015-07-12: 80 mL via INTRA_ARTERIAL

## 2015-07-12 MED ORDER — SODIUM CHLORIDE 0.9 % WEIGHT BASED INFUSION: 3.0000 mL/kg/h | INTRAVENOUS | Status: DC

## 2015-07-12 MED ORDER — MIDAZOLAM HCL 2 MG/2ML IJ SOLN
INTRAMUSCULAR | Status: DC | PRN
  Administered 2015-07-12 (×2): 1 mg via INTRAVENOUS

## 2015-07-12 MED ORDER — SODIUM CHLORIDE 0.9 % IV SOLN
INTRAVENOUS | Status: DC
  Administered 2015-07-12: 13:00:00 via INTRAVENOUS

## 2015-07-12 MED ORDER — FUROSEMIDE 10 MG/ML IJ SOLN
INTRAMUSCULAR | Status: AC
  Filled 2015-07-12: qty 2

## 2015-07-12 MED ORDER — FENTANYL CITRATE (PF) 100 MCG/2ML IJ SOLN
INTRAMUSCULAR | Status: DC | PRN
  Administered 2015-07-12: 50 ug via INTRAVENOUS
  Administered 2015-07-12: 25 ug via INTRAVENOUS

## 2015-07-12 MED ORDER — FUROSEMIDE 10 MG/ML IJ SOLN
INTRAMUSCULAR | Status: DC | PRN
  Administered 2015-07-12: 20 mg via INTRAVENOUS

## 2015-07-12 MED ORDER — FENTANYL CITRATE (PF) 100 MCG/2ML IJ SOLN
INTRAMUSCULAR | Status: AC
  Filled 2015-07-12: qty 2

## 2015-07-12 MED ORDER — ONDANSETRON HCL 4 MG/2ML IJ SOLN: 4.0000 mg | Freq: Four times a day (QID) | INTRAMUSCULAR | Status: DC | PRN

## 2015-07-12 MED ORDER — SODIUM CHLORIDE 0.9 % IJ SOLN: 3.0000 mL | Freq: Two times a day (BID) | INTRAMUSCULAR | Status: DC

## 2015-07-12 SURGICAL SUPPLY — 12 items
CATH INFINITI 5FR ANG PIGTAIL (CATHETERS) ×4 IMPLANT
CATH INFINITI 5FR JL4 (CATHETERS) ×4 IMPLANT
CATH INFINITI JR4 5F (CATHETERS) ×4 IMPLANT
CATH SWANZ 7F THERMO (CATHETERS) ×4 IMPLANT
DEVICE CLOSURE MYNXGRIP 5F (Vascular Products) ×4 IMPLANT
KIT MANI 3VAL PERCEP (MISCELLANEOUS) ×4 IMPLANT
KIT RIGHT HEART (MISCELLANEOUS) ×4 IMPLANT
NEEDLE PERC 18GX7CM (NEEDLE) ×4 IMPLANT
PACK CARDIAC CATH (CUSTOM PROCEDURE TRAY) ×4 IMPLANT
SHEATH AVANTI 5FR X 11CM (SHEATH) ×4 IMPLANT
SHEATH PINNACLE 7F 10CM (SHEATH) ×4 IMPLANT
WIRE EMERALD 3MM-J .035X150CM (WIRE) ×4 IMPLANT

## 2015-07-12 NOTE — Discharge Instructions (Signed)

## 2015-07-13 ENCOUNTER — Encounter: Payer: Self-pay | Admitting: Internal Medicine

## 2015-07-14 ENCOUNTER — Other Ambulatory Visit: Payer: Self-pay | Admitting: Internal Medicine

## 2015-07-14 ENCOUNTER — Encounter: Payer: Self-pay | Admitting: Internal Medicine

## 2015-07-14 DIAGNOSIS — I428 Other cardiomyopathies: Secondary | ICD-10-CM | POA: Insufficient documentation

## 2015-07-17 ENCOUNTER — Other Ambulatory Visit: Payer: Self-pay | Admitting: Internal Medicine

## 2015-08-10 ENCOUNTER — Other Ambulatory Visit: Payer: Self-pay

## 2015-08-10 MED ORDER — MEDROXYPROGESTERONE ACETATE 2.5 MG PO TABS: 2.5000 mg | ORAL_TABLET | Freq: Every day | ORAL | Status: DC

## 2015-08-10 MED ORDER — ESTRADIOL 0.5 MG PO TABS: ORAL_TABLET | ORAL | Status: DC

## 2015-08-22 ENCOUNTER — Encounter: Payer: Self-pay | Admitting: Internal Medicine

## 2015-11-28 ENCOUNTER — Encounter: Payer: Self-pay | Admitting: Internal Medicine

## 2015-11-28 ENCOUNTER — Ambulatory Visit (INDEPENDENT_AMBULATORY_CARE_PROVIDER_SITE_OTHER): Payer: BLUE CROSS/BLUE SHIELD | Admitting: Internal Medicine

## 2015-11-28 VITALS — BP 140/62 | HR 88 | Ht 62.0 in | Wt 151.6 lb

## 2015-11-28 DIAGNOSIS — Z780 Asymptomatic menopausal state: Secondary | ICD-10-CM

## 2015-11-28 DIAGNOSIS — E119 Type 2 diabetes mellitus without complications: Secondary | ICD-10-CM

## 2015-11-28 DIAGNOSIS — Z1211 Encounter for screening for malignant neoplasm of colon: Secondary | ICD-10-CM

## 2015-11-28 DIAGNOSIS — I429 Cardiomyopathy, unspecified: Secondary | ICD-10-CM | POA: Diagnosis not present

## 2015-11-28 DIAGNOSIS — I428 Other cardiomyopathies: Secondary | ICD-10-CM

## 2015-11-28 MED ORDER — FLUCONAZOLE 100 MG PO TABS: 100.0000 mg | ORAL_TABLET | Freq: Every day | ORAL | Status: DC

## 2015-11-28 MED ORDER — ESTRADIOL 0.5 MG PO TABS: ORAL_TABLET | ORAL | Status: DC

## 2015-11-28 MED ORDER — MEDROXYPROGESTERONE ACETATE 2.5 MG PO TABS: 2.5000 mg | ORAL_TABLET | Freq: Every day | ORAL | Status: DC

## 2015-11-28 NOTE — Progress Notes (Signed)
Date:  11/28/2015   Name:  Lori Brewer   DOB:  1961/04/21   MRN:  [id number]   Chief Complaint: Follow-up and Diabetes Diabetes She presents for her follow-up diabetic visit. She has type 2 diabetes mellitus. Her disease course has been improving. Pertinent negatives for hypoglycemia include no dizziness or headaches. Pertinent negatives for diabetes include no chest pain and no fatigue. There are no hypoglycemic complications. Symptoms are improving. She rarely participates in exercise. Her home blood glucose trend is decreasing steadily. Her breakfast blood glucose is taken between 8-9 am. Her breakfast blood glucose range is generally 130-140 mg/dl. An ACE inhibitor/angiotensin II receptor blocker is being taken.  Cardiomyopathy - EF 25% initially.  Cardiac cath was normal. Symptoms now are mostly fatigue.  No chest pain or cough.  Dr. Juliann Pares thinks maybe it was viral or possibly inherited.  She has been started on Coreg with Lasix and since then has decreased appetite and has lost about 9 lbs.  She had a repeat ECHO yesterday - she will get the results next week.  Overall she has fatigue but continues to work.  She may be referred to the [facility] Failure clinic for AICD. Menopausal symptoms - much improved with HRT.  Patient requests a refill.  She denies vaginal bleeding or breast mass.  Mammogram is up to date.  Lab Results  Component Value Date   HGBA1C 10.1* 05/15/2015    Review of Systems  Constitutional: Negative for fever, chills and fatigue.  Respiratory: Positive for shortness of breath. Negative for cough, choking, chest tightness and wheezing.   Cardiovascular: Negative for chest pain, palpitations and leg swelling.  Gastrointestinal: Negative for abdominal pain and diarrhea.  Neurological: Negative for dizziness, syncope and headaches.  Psychiatric/Behavioral: Negative for sleep disturbance and dysphoric mood.    Patient Active Problem List   Diagnosis Date Noted  .  Non-ischemic cardiomyopathy (HCC) 07/14/2015  . Acute bronchospasm 06/19/2015  . Menopause 05/15/2015  . Anxiety, mild 03/30/2015  . Controlled diabetes mellitus type II without complication (HCC) 03/30/2015  . Essential (primary) hypertension 03/30/2015  . Combined fat and carbohydrate induced hyperlipemia 03/30/2015  . Sinusitis, bacterial 03/30/2015    Prior to Admission medications   Medication Sig Start Date End Date Taking? Authorizing Provider  Alogliptin-Metformin HCl 12.5-500 MG TABS  05/17/15  Yes Historical Provider, MD  atorvastatin (LIPITOR) 20 MG tablet Take 1 tablet by mouth daily at 2 PM daily at 2 PM. 01/11/15  Yes Historical Provider, MD  carvedilol (COREG) 3.125 MG tablet Take 1 tablet by mouth 2 (two) times daily. 10/24/15  Yes Historical Provider, MD  estradiol (ESTRACE) 0.5 MG tablet TAKE 1 TABLET (0.5 MG TOTAL) BY MOUTH DAILY. 08/10/15  Yes Reubin Milan, MD  furosemide (LASIX) 20 MG tablet Take 1 tablet by mouth daily. 10/24/15  Yes Historical Provider, MD  glucose blood test strip  06/26/14  Yes Historical Provider, MD  lisinopril (PRINIVIL,ZESTRIL) 40 MG tablet Take 1 tablet by mouth daily. 10/31/15  Yes Historical Provider, MD  medroxyPROGESTERone (PROVERA) 2.5 MG tablet Take 1 tablet (2.5 mg total) by mouth daily. 08/10/15  Yes Reubin Milan, MD  metFORMIN (GLUCOPHAGE) 500 MG tablet Take 1 tablet (500 mg total) by mouth 2 (two) times daily with a meal. 04/16/15  Yes Duanne Limerick, MD  [facility] DELICA LANCETS 33G MISC  06/29/15  Yes Historical Provider, MD    No Known Allergies  Past Surgical History  Procedure Laterality Date  .  Gallbladder surgery    . Cesarean section    . Cardiac catheterization N/A 07/12/2015    Procedure: Right and Left Heart Cath;  Surgeon: Alwyn Pea, MD;  Location: [facility] INVASIVE CV LAB;  Service: Cardiovascular;  Laterality: N/A;    Social History  Substance Use Topics  . Smoking status: Former Games developer  . Smokeless tobacco:  Never Used  . Alcohol Use: 7.2 oz/week    0 Standard drinks or equivalent, 12 Cans of beer per week     Comment: occasional     Medication list has been reviewed and updated.   Physical Exam  Constitutional: She is oriented to person, place, and time. She appears well-developed. No distress.  HENT:  Head: Normocephalic and atraumatic.  Cardiovascular: Normal rate, regular rhythm and normal heart sounds.   Pulmonary/Chest: Effort normal and breath sounds normal. No respiratory distress.  Musculoskeletal: Normal range of motion. She exhibits no edema or tenderness.  Neurological: She is alert and oriented to person, place, and time.  Skin: Skin is warm and dry. No rash noted.  Psychiatric: She has a normal mood and affect. Her behavior is normal. Thought content normal.    BP 140/62 mmHg  Pulse 88  Ht  (1.575 m)  Wt 151 lb 9.6 oz (68.765 kg)  BMI 27.72 kg/m2  Assessment and Plan: 1. Controlled type 2 diabetes mellitus without complication, without long-term current use of insulin (HCC) Improved FSBS - weight loss helpful Continue current therapy with Kazano plus extra metformin - Hemoglobin A1c  2. Menopause Doing well on HRT - estradiol (ESTRACE) 0.5 MG tablet; TAKE 1 TABLET (0.5 MG TOTAL) BY MOUTH DAILY.  Dispense: 90 tablet; Refill: 3 - medroxyPROGESTERone (PROVERA) 2.5 MG tablet; Take 1 tablet (2.5 mg total) by mouth daily.  Dispense: 90 tablet; Refill: 3  3. Non-ischemic cardiomyopathy (HCC) Continue current medications Strongly encourage AICD placement  4. Colon cancer screening Will refer to GI for screening colonoscopy   Bari Edward, MD [facility]  11/28/2015

## 2015-11-29 ENCOUNTER — Telehealth: Payer: Self-pay

## 2015-11-29 LAB — HEMOGLOBIN A1C
Est. average glucose Bld gHb Est-mCnc: 146 mg/dL
HEMOGLOBIN A1C: 6.7 % — AB (ref 4.8–5.6)

## 2015-11-29 NOTE — Telephone Encounter (Signed)
Spoke with patient. Patient advised of all results and verbalized understanding. Will call back with any future questions or concerns. MAH  

## 2015-11-29 NOTE — Telephone Encounter (Signed)
-----   Message from Reubin Milan, MD sent at 11/29/2015  9:01 AM EDT ----- DM is great!  A1C is 6.7.  Continue same medications.

## 2015-12-13 DIAGNOSIS — J019 Acute sinusitis, unspecified: Secondary | ICD-10-CM | POA: Diagnosis not present

## 2015-12-15 IMAGING — CT CT CHEST HIGH RESOLUTION W/O CM
3 of 5 series · 16 of 33 positions shown, 18 images · non-contrast
Comparison: None.

CLINICAL DATA: 54-year-old female with history of pneumonia earlier
this year treated with antibiotics, now with complaint of shortness
of breath while working out (which is new).

EXAM:
CT CHEST WITHOUT CONTRAST
TECHNIQUE: Multidetector CT imaging of the chest was performed following the
standard protocol without intravenous contrast. High resolution
imaging of the lungs, as well as inspiratory and expiratory imaging,
was performed.

[Series 2: routine chest wo · axial · 0.65mm/px · z∈[-755,-530]mm · 8 of 61 slices shown, 10 images]
[im 8/61  mediastinal]
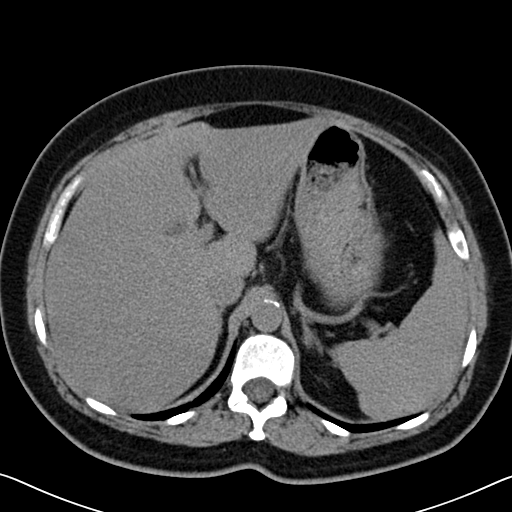
[im 8/61  lung]
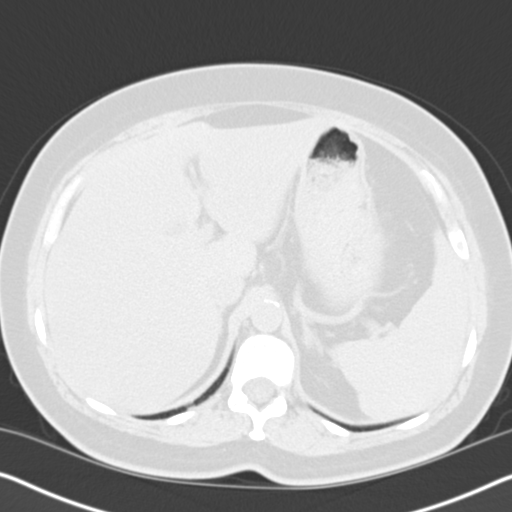
[im 16/61  lung]
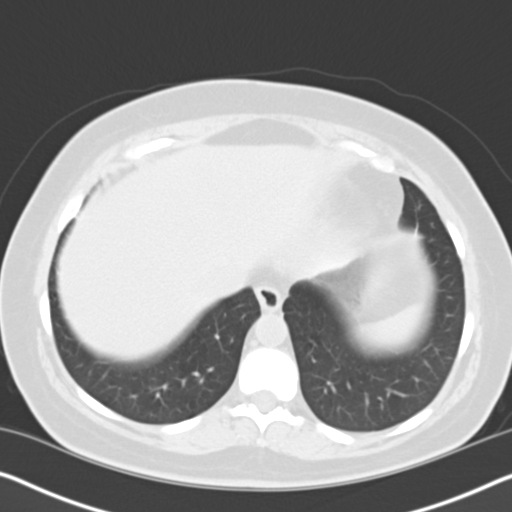
[im 23/61  lung]
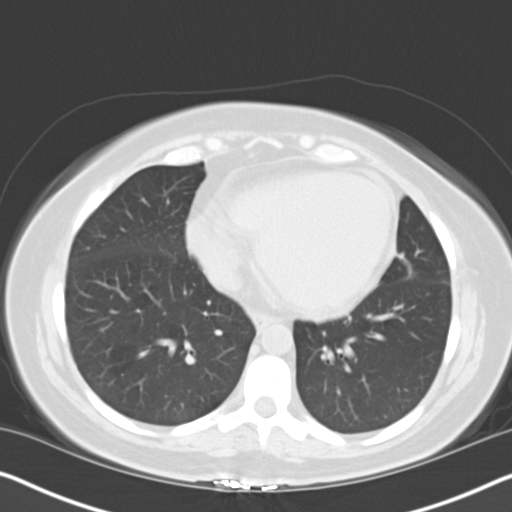
[im 30/61  lung]
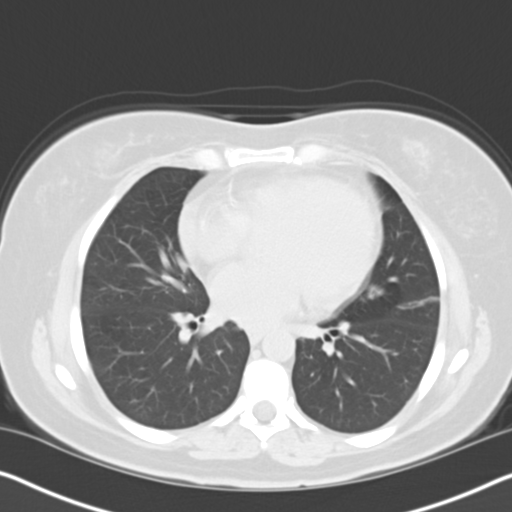
[im 31/61  mediastinal]
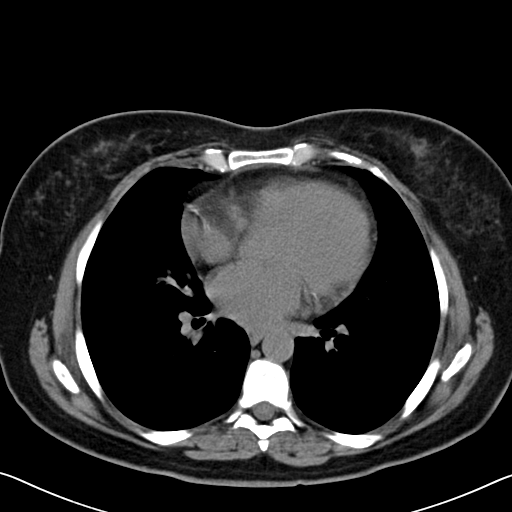
[im 31/61  lung]
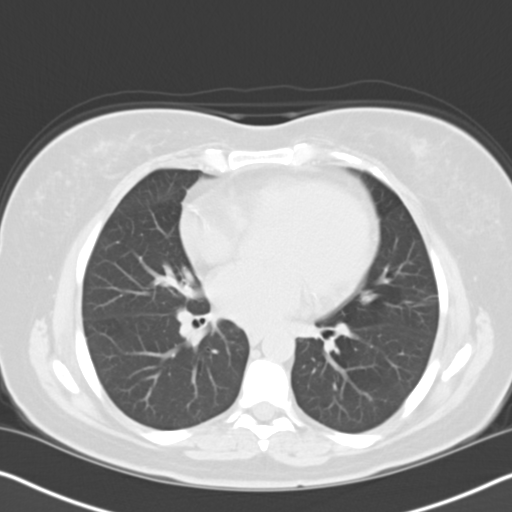
[im 38/61  lung]
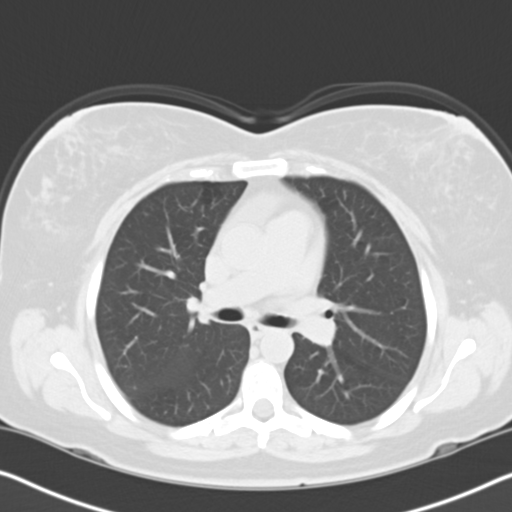
[im 46/61  lung]
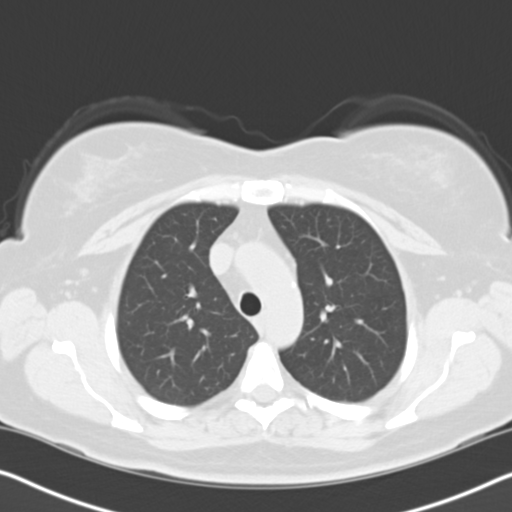
[im 53/61  lung]
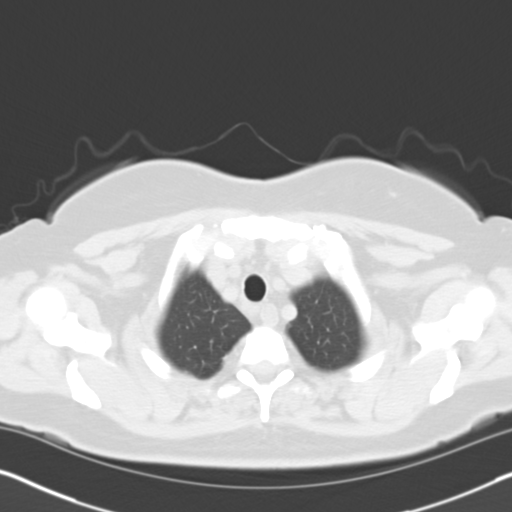

[Series 3: lung · axial · 0.65mm/px · z∈[-755,-655]mm · 4 of 60 slices shown]
[im 7/60  lung]
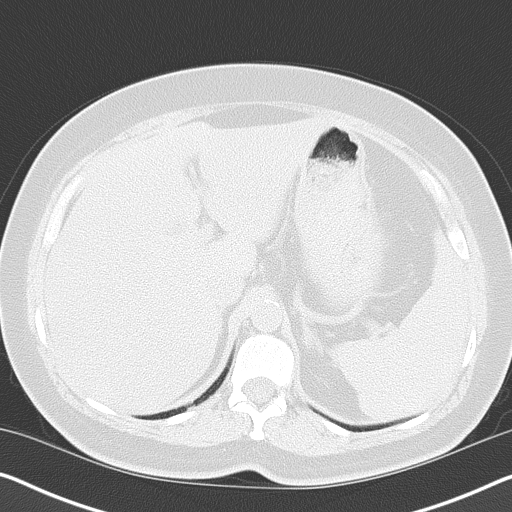
[im 14/60  lung]
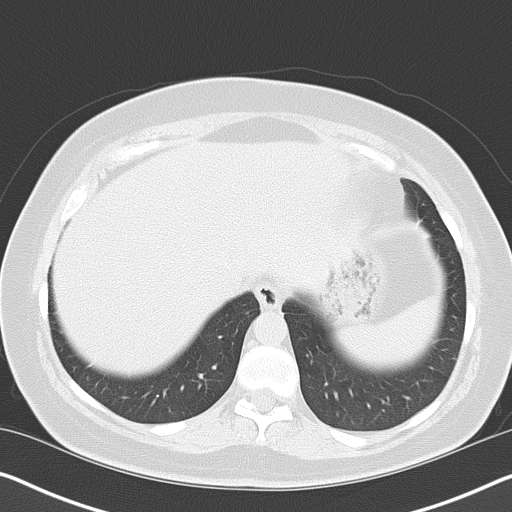
[im 20/60  lung]
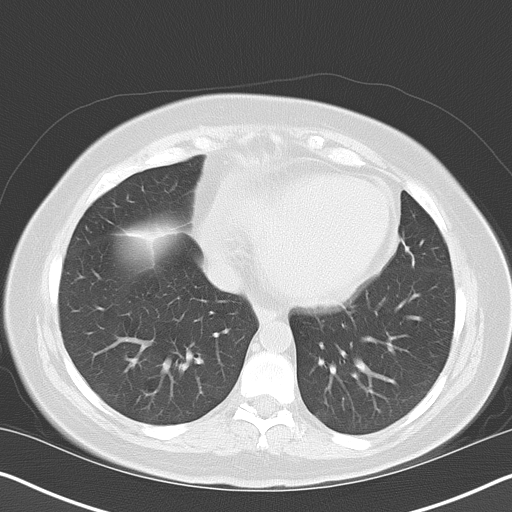
[im 27/60  lung]
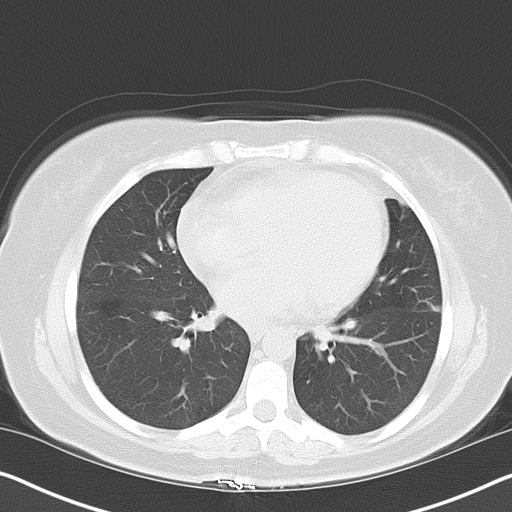

[Series 7: high res lungs · axial · 0.71mm/px · z∈[-715,-565]mm · 4 of 31 slices shown]
[im 8/31  lung]
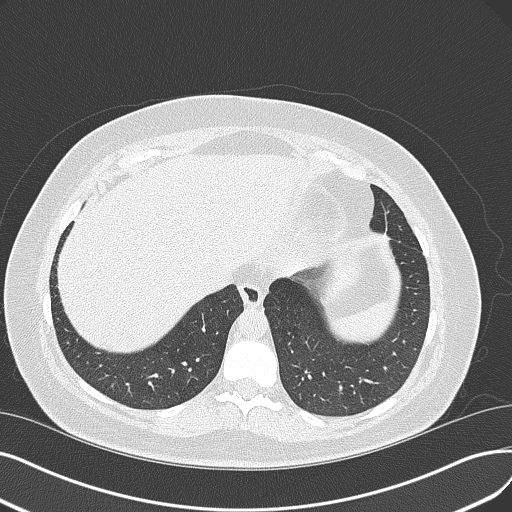
[im 15/31  lung]
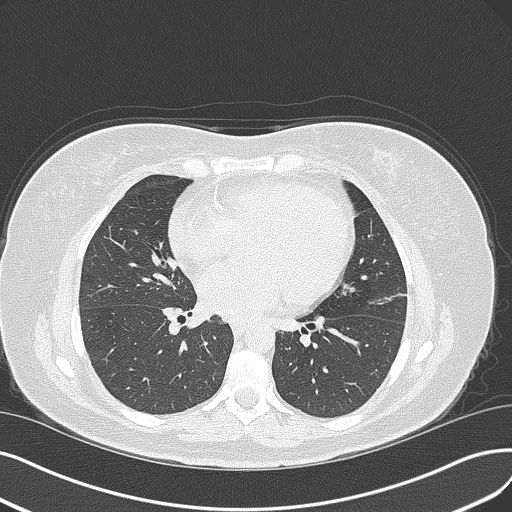
[im 16/31  lung]
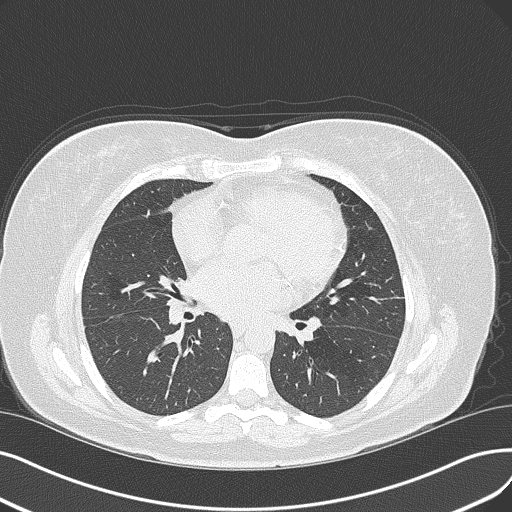
[im 23/31  lung]
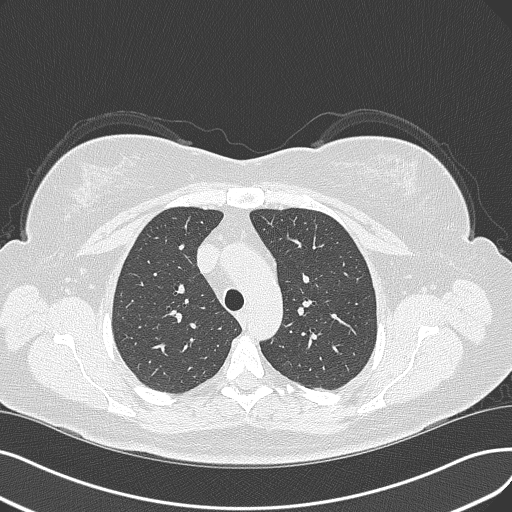

[16 of 33 positions shown; findings below may reference images not displayed]

FINDINGS: Mediastinum/Lymph Nodes: Heart size is mildly enlarged. There is no
significant pericardial fluid, thickening or pericardial
calcification. There is atherosclerosis of the thoracic aorta, the
great vessels of the mediastinum and the coronary arteries,
including calcified atherosclerotic plaque in the left main, left
anterior descending, left circumflex and right coronary arteries. No
pathologically enlarged mediastinal or hilar lymph nodes. Esophagus
is unremarkable in appearance. No axillary lymphadenopathy.

Lungs/Pleura: High-resolution images demonstrate no areas of
significant ground-glass attenuation, subpleural reticulation,
parenchymal banding, traction bronchiectasis or frank honeycombing
to suggest interstitial lung disease at this time. Inspiratory and
expiratory imaging demonstrates mild air trapping, indicative of
mild small airways disease. Mild linear scarring in the inferior
segment of the lingula, likely post infectious or inflammatory. No
acute consolidative airspace disease. No pleural effusions.

Upper abdomen: Status post cholecystectomy. Well-defined 12 x 22 mm
low-attenuation lesion adjacent to the gallbladder fossa in segment
4B of the liver is incompletely characterized on today's noncontrast
CT examination, but is statistically likely a small cyst.

Musculoskeletal: There are no aggressive appearing lytic or blastic
lesions noted in the visualized portions of the skeleton.
IMPRESSION: 1. No findings to suggest interstitial lung disease.
2. No acute findings in the thorax.
3. Mild air trapping, indicative of mild small airways disease.
4. Mild cardiomegaly.
5. Atherosclerosis, including left main and 3 vessel coronary artery
disease. Please note that although the presence of coronary artery
calcium documents the presence of coronary artery disease, the
severity of this disease and any potential stenosis cannot be
assessed on this non-gated CT examination. Assessment for potential
risk factor modification, dietary therapy or pharmacologic therapy
may be warranted, if clinically indicated.

## 2015-12-17 ENCOUNTER — Encounter: Payer: Self-pay | Admitting: Internal Medicine

## 2015-12-17 ENCOUNTER — Ambulatory Visit (INDEPENDENT_AMBULATORY_CARE_PROVIDER_SITE_OTHER): Payer: BLUE CROSS/BLUE SHIELD | Admitting: Internal Medicine

## 2015-12-17 VITALS — BP 120/68 | HR 62 | Temp 98.6°F | Resp 16 | Ht 62.0 in | Wt 148.0 lb

## 2015-12-17 DIAGNOSIS — A499 Bacterial infection, unspecified: Secondary | ICD-10-CM | POA: Diagnosis not present

## 2015-12-17 DIAGNOSIS — M940 Chondrocostal junction syndrome [Tietze]: Secondary | ICD-10-CM

## 2015-12-17 DIAGNOSIS — I428 Other cardiomyopathies: Secondary | ICD-10-CM

## 2015-12-17 DIAGNOSIS — J329 Chronic sinusitis, unspecified: Secondary | ICD-10-CM | POA: Diagnosis not present

## 2015-12-17 DIAGNOSIS — I10 Essential (primary) hypertension: Secondary | ICD-10-CM

## 2015-12-17 DIAGNOSIS — I429 Cardiomyopathy, unspecified: Secondary | ICD-10-CM

## 2015-12-17 DIAGNOSIS — B9689 Other specified bacterial agents as the cause of diseases classified elsewhere: Secondary | ICD-10-CM

## 2015-12-17 MED ORDER — NAPROXEN 500 MG PO TABS: 500.0000 mg | ORAL_TABLET | Freq: Two times a day (BID) | ORAL | Status: DC

## 2015-12-17 NOTE — Patient Instructions (Signed)

## 2015-12-17 NOTE — Progress Notes (Signed)
Date:  12/17/2015   Name:  Lori Brewer   DOB:  November 03, 1960   MRN:  [id number]   Chief Complaint: Chest Pain Chest Pain  This is a new problem. The current episode started in the past 7 days. The onset quality is gradual. The problem has been gradually worsening. The pain is present in the substernal region (and is relieved by pushing or holding pressure on the sternum). The pain is mild. The quality of the pain is described as pressure. Pertinent negatives include no abdominal pain, cough, dizziness, headaches, palpitations or shortness of breath. The pain is aggravated by breathing (and twisting).   She also has abdominal fullness - takes only a few bites and feels full.  She denies dysphagia or frequent emesis.  She has no change in bowel habits other than loose stools from current Augmentin therapy for sinusitis.  Non-ischemic Cardiomyopathy - she reports decreased appetite since last fall when she was diagnosed and put on Coreg.  Her visit with the Cardiologist was last month - he did not think that medication was the cause.  She is awaiting an appointment for a Defibrillator and a visit with the [facility].  She complains of fatigue and is struggling to get to work every day.  Review of Systems  Constitutional: Positive for fatigue. Negative for chills and unexpected weight change.  HENT: Positive for congestion and sinus pressure. Negative for tinnitus, trouble swallowing and voice change.   Eyes: Negative for visual disturbance.  Respiratory: Negative for cough, chest tightness, shortness of breath and wheezing.   Cardiovascular: Positive for chest pain. Negative for palpitations and leg swelling.  Gastrointestinal: Positive for diarrhea and abdominal distention. Negative for abdominal pain, constipation and blood in stool.  Genitourinary: Negative for dysuria and hematuria.  Skin: Negative for color change.  Neurological: Negative for dizziness, tremors, syncope and  headaches.  Psychiatric/Behavioral: Negative for sleep disturbance.    Patient Active Problem List   Diagnosis Date Noted  . Non-ischemic cardiomyopathy (HCC) 07/14/2015  . Acute bronchospasm 06/19/2015  . Interstitial lung disease (HCC) 06/19/2015  . Menopause 05/15/2015  . Anxiety, mild 03/30/2015  . Controlled diabetes mellitus type II without complication (HCC) 03/30/2015  . Essential (primary) hypertension 03/30/2015  . Combined fat and carbohydrate induced hyperlipemia 03/30/2015  . Sinusitis, bacterial 03/30/2015    Prior to Admission medications   Medication Sig Start Date End Date Taking? Authorizing Provider  Alogliptin-Metformin HCl 12.5-500 MG TABS  05/17/15  Yes Historical Provider, MD  atorvastatin (LIPITOR) 20 MG tablet Take 1 tablet by mouth daily at 2 PM daily at 2 PM. 01/11/15  Yes Historical Provider, MD  carvedilol (COREG) 3.125 MG tablet Take 1 tablet by mouth 2 (two) times daily. 10/24/15  Yes Historical Provider, MD  estradiol (ESTRACE) 0.5 MG tablet TAKE 1 TABLET (0.5 MG TOTAL) BY MOUTH DAILY. 11/28/15  Yes Reubin Milan, MD  furosemide (LASIX) 20 MG tablet Take 1 tablet by mouth daily. 10/24/15  Yes Historical Provider, MD  glucose blood test strip  06/26/14  Yes Historical Provider, MD  lisinopril (PRINIVIL,ZESTRIL) 40 MG tablet Take 1 tablet by mouth daily. 10/31/15  Yes Historical Provider, MD  medroxyPROGESTERone (PROVERA) 2.5 MG tablet Take 1 tablet (2.5 mg total) by mouth daily. 11/28/15  Yes Reubin Milan, MD  metFORMIN (GLUCOPHAGE) 500 MG tablet Take 1 tablet (500 mg total) by mouth 2 (two) times daily with a meal. 04/16/15  Yes Duanne Limerick, MD  [facility]  DELICA LANCETS 33G MISC  06/29/15  Yes Historical Provider, MD  fluconazole (DIFLUCAN) 100 MG tablet Take 1 tablet (100 mg total) by mouth daily. Patient not taking: Reported on 12/17/2015 11/28/15   Reubin Milan, MD    No Known Allergies  Past Surgical History  Procedure Laterality Date  .  Gallbladder surgery    . Cesarean section    . Cardiac catheterization N/A 07/12/2015    Procedure: Right and Left Heart Cath;  Surgeon: Alwyn Pea, MD;  Location: [facility] INVASIVE CV LAB;  Service: Cardiovascular;  Laterality: N/A;    Social History  Substance Use Topics  . Smoking status: Former Games developer  . Smokeless tobacco: Never Used  . Alcohol Use: 7.2 oz/week    0 Standard drinks or equivalent, 12 Cans of beer per week     Comment: occasional     Medication list has been reviewed and updated.   Physical Exam  Constitutional: She is oriented to person, place, and time. She appears well-developed. No distress.  HENT:  Head: Normocephalic and atraumatic.  Neck: Normal range of motion.  Cardiovascular: Normal rate, regular rhythm and normal heart sounds.   Pulmonary/Chest: Effort normal and breath sounds normal. No respiratory distress. She has no wheezes. She exhibits tenderness (along distal left sternum/ribs).  Abdominal: Soft. Normal appearance. Bowel sounds are decreased. There is no tenderness.  Musculoskeletal: Normal range of motion. She exhibits no edema.  Lymphadenopathy:    She has no cervical adenopathy.  Neurological: She is alert and oriented to person, place, and time.  Skin: Skin is warm and dry. No rash noted.  Psychiatric: She has a normal mood and affect. Her behavior is normal. Thought content normal.  Nursing note and vitals reviewed.   BP 120/68 mmHg  Pulse 62  Temp(Src) 98.6 F (37 C)  Resp 16  Ht  (1.575 m)  Wt 148 lb (67.132 kg)  BMI 27.06 kg/m2  SpO2 97%  Assessment and Plan: 1. Costochondritis Use moist heat - naproxen (NAPROSYN) 500 MG tablet; Take 1 tablet (500 mg total) by mouth 2 (two) times daily with a meal.  Dispense: 60 tablet; Refill: 0  2. Non-ischemic cardiomyopathy (HCC) Continue current medication Consider short term disability until medication and symptoms can be regulated  3. Essential (primary)  hypertension controlled  4. Sinusitis, bacterial Finish Augmentin - take with food   Bari Edward, MD [facility]  12/17/2015

## 2016-01-02 ENCOUNTER — Other Ambulatory Visit: Payer: Self-pay

## 2016-01-02 DIAGNOSIS — E119 Type 2 diabetes mellitus without complications: Secondary | ICD-10-CM

## 2016-01-02 DIAGNOSIS — Z794 Long term (current) use of insulin: Principal | ICD-10-CM

## 2016-01-02 MED ORDER — METFORMIN HCL 500 MG PO TABS: 500.0000 mg | ORAL_TABLET | Freq: Two times a day (BID) | ORAL | Status: DC

## 2016-01-02 NOTE — Telephone Encounter (Signed)
Received fax from pharmacy.

## 2016-01-04 DIAGNOSIS — I5022 Chronic systolic (congestive) heart failure: Secondary | ICD-10-CM | POA: Diagnosis not present

## 2016-01-04 DIAGNOSIS — I428 Other cardiomyopathies: Secondary | ICD-10-CM | POA: Diagnosis not present

## 2016-01-07 HISTORY — PX: CARDIAC DEFIBRILLATOR PLACEMENT: SHX171

## 2016-01-11 ENCOUNTER — Other Ambulatory Visit: Payer: Self-pay | Admitting: Internal Medicine

## 2016-01-25 DIAGNOSIS — I428 Other cardiomyopathies: Secondary | ICD-10-CM | POA: Diagnosis not present

## 2016-01-25 DIAGNOSIS — Z8679 Personal history of other diseases of the circulatory system: Secondary | ICD-10-CM | POA: Diagnosis not present

## 2016-01-25 DIAGNOSIS — I429 Cardiomyopathy, unspecified: Secondary | ICD-10-CM | POA: Diagnosis not present

## 2016-01-28 DIAGNOSIS — Z79899 Other long term (current) drug therapy: Secondary | ICD-10-CM | POA: Diagnosis not present

## 2016-01-28 DIAGNOSIS — I420 Dilated cardiomyopathy: Secondary | ICD-10-CM | POA: Diagnosis not present

## 2016-01-28 DIAGNOSIS — I110 Hypertensive heart disease with heart failure: Secondary | ICD-10-CM | POA: Diagnosis not present

## 2016-01-28 DIAGNOSIS — I5022 Chronic systolic (congestive) heart failure: Secondary | ICD-10-CM | POA: Diagnosis not present

## 2016-01-28 DIAGNOSIS — Z7984 Long term (current) use of oral hypoglycemic drugs: Secondary | ICD-10-CM | POA: Diagnosis not present

## 2016-01-28 DIAGNOSIS — E7801 Familial hypercholesterolemia: Secondary | ICD-10-CM | POA: Diagnosis not present

## 2016-01-28 DIAGNOSIS — I447 Left bundle-branch block, unspecified: Secondary | ICD-10-CM | POA: Diagnosis not present

## 2016-01-28 DIAGNOSIS — E119 Type 2 diabetes mellitus without complications: Secondary | ICD-10-CM | POA: Diagnosis not present

## 2016-01-28 DIAGNOSIS — I5020 Unspecified systolic (congestive) heart failure: Secondary | ICD-10-CM | POA: Diagnosis not present

## 2016-01-28 DIAGNOSIS — J45909 Unspecified asthma, uncomplicated: Secondary | ICD-10-CM | POA: Diagnosis not present

## 2016-01-28 DIAGNOSIS — Z87891 Personal history of nicotine dependence: Secondary | ICD-10-CM | POA: Diagnosis not present

## 2016-01-28 DIAGNOSIS — I4891 Unspecified atrial fibrillation: Secondary | ICD-10-CM | POA: Diagnosis not present

## 2016-01-29 DIAGNOSIS — Z87891 Personal history of nicotine dependence: Secondary | ICD-10-CM | POA: Diagnosis not present

## 2016-01-29 DIAGNOSIS — I5022 Chronic systolic (congestive) heart failure: Secondary | ICD-10-CM | POA: Diagnosis not present

## 2016-01-29 DIAGNOSIS — I428 Other cardiomyopathies: Secondary | ICD-10-CM | POA: Diagnosis not present

## 2016-01-29 DIAGNOSIS — I429 Cardiomyopathy, unspecified: Secondary | ICD-10-CM | POA: Diagnosis not present

## 2016-01-29 DIAGNOSIS — Z7984 Long term (current) use of oral hypoglycemic drugs: Secondary | ICD-10-CM | POA: Diagnosis not present

## 2016-01-29 DIAGNOSIS — E7801 Familial hypercholesterolemia: Secondary | ICD-10-CM | POA: Diagnosis not present

## 2016-01-29 DIAGNOSIS — I447 Left bundle-branch block, unspecified: Secondary | ICD-10-CM | POA: Diagnosis not present

## 2016-01-29 DIAGNOSIS — I420 Dilated cardiomyopathy: Secondary | ICD-10-CM | POA: Diagnosis not present

## 2016-01-29 DIAGNOSIS — Z79899 Other long term (current) drug therapy: Secondary | ICD-10-CM | POA: Diagnosis not present

## 2016-01-29 DIAGNOSIS — J9811 Atelectasis: Secondary | ICD-10-CM | POA: Diagnosis not present

## 2016-01-29 DIAGNOSIS — J45909 Unspecified asthma, uncomplicated: Secondary | ICD-10-CM | POA: Diagnosis not present

## 2016-01-29 DIAGNOSIS — R918 Other nonspecific abnormal finding of lung field: Secondary | ICD-10-CM | POA: Diagnosis not present

## 2016-01-29 DIAGNOSIS — I4891 Unspecified atrial fibrillation: Secondary | ICD-10-CM | POA: Diagnosis not present

## 2016-01-29 DIAGNOSIS — I110 Hypertensive heart disease with heart failure: Secondary | ICD-10-CM | POA: Diagnosis not present

## 2016-01-29 DIAGNOSIS — E119 Type 2 diabetes mellitus without complications: Secondary | ICD-10-CM | POA: Diagnosis not present

## 2016-02-14 DIAGNOSIS — I428 Other cardiomyopathies: Secondary | ICD-10-CM | POA: Diagnosis not present

## 2016-03-05 DIAGNOSIS — E119 Type 2 diabetes mellitus without complications: Secondary | ICD-10-CM | POA: Diagnosis not present

## 2016-03-05 DIAGNOSIS — I428 Other cardiomyopathies: Secondary | ICD-10-CM | POA: Diagnosis not present

## 2016-03-05 DIAGNOSIS — I5022 Chronic systolic (congestive) heart failure: Secondary | ICD-10-CM | POA: Diagnosis not present

## 2016-03-05 DIAGNOSIS — R0602 Shortness of breath: Secondary | ICD-10-CM | POA: Diagnosis not present

## 2016-05-16 DIAGNOSIS — I5022 Chronic systolic (congestive) heart failure: Secondary | ICD-10-CM | POA: Diagnosis not present

## 2016-05-16 DIAGNOSIS — Z95810 Presence of automatic (implantable) cardiac defibrillator: Secondary | ICD-10-CM | POA: Insufficient documentation

## 2016-07-02 ENCOUNTER — Encounter: Payer: Self-pay | Admitting: Internal Medicine

## 2016-07-02 ENCOUNTER — Ambulatory Visit (INDEPENDENT_AMBULATORY_CARE_PROVIDER_SITE_OTHER): Payer: BLUE CROSS/BLUE SHIELD | Admitting: Internal Medicine

## 2016-07-02 ENCOUNTER — Other Ambulatory Visit: Payer: Self-pay

## 2016-07-02 ENCOUNTER — Other Ambulatory Visit: Payer: Self-pay | Admitting: Internal Medicine

## 2016-07-02 VITALS — BP 138/80 | HR 74 | Resp 16 | Ht 62.0 in | Wt 151.0 lb

## 2016-07-02 DIAGNOSIS — E782 Mixed hyperlipidemia: Secondary | ICD-10-CM

## 2016-07-02 DIAGNOSIS — Z780 Asymptomatic menopausal state: Secondary | ICD-10-CM | POA: Diagnosis not present

## 2016-07-02 DIAGNOSIS — E119 Type 2 diabetes mellitus without complications: Secondary | ICD-10-CM

## 2016-07-02 DIAGNOSIS — I10 Essential (primary) hypertension: Secondary | ICD-10-CM

## 2016-07-02 DIAGNOSIS — Z1231 Encounter for screening mammogram for malignant neoplasm of breast: Secondary | ICD-10-CM

## 2016-07-02 DIAGNOSIS — Z0000 Encounter for general adult medical examination without abnormal findings: Secondary | ICD-10-CM | POA: Diagnosis not present

## 2016-07-02 DIAGNOSIS — R790 Abnormal level of blood mineral: Secondary | ICD-10-CM

## 2016-07-02 DIAGNOSIS — I428 Other cardiomyopathies: Secondary | ICD-10-CM

## 2016-07-02 DIAGNOSIS — Z1159 Encounter for screening for other viral diseases: Secondary | ICD-10-CM

## 2016-07-02 DIAGNOSIS — Z1239 Encounter for other screening for malignant neoplasm of breast: Secondary | ICD-10-CM

## 2016-07-02 LAB — POCT URINALYSIS DIPSTICK
BILIRUBIN UA: NEGATIVE
Blood, UA: NEGATIVE
Glucose, UA: NEGATIVE
KETONES UA: NEGATIVE
LEUKOCYTES UA: NEGATIVE
Nitrite, UA: NEGATIVE
Protein, UA: NEGATIVE
SPEC GRAV UA: 1.01
Urobilinogen, UA: 0.2
pH, UA: 6.5

## 2016-07-02 NOTE — Progress Notes (Signed)
Date:  07/02/2016   Name:  Lori Brewer   DOB:  1960-09-28   MRN:  [id number]   Chief Complaint: Annual Exam (Had Defibulator put in in May 2017.) Lori Brewer is a 55 y.o. female who presents today for her Complete Annual Exam. She feels fairly well. She reports exercising walking. She reports she is sleeping well. No breast problems - mammogram is scheduled. Her hot flashes are fairly well controlled on estrogen.  Diabetes  She presents for her follow-up diabetic visit. She has type 2 diabetes mellitus. Her disease course has been stable. Pertinent negatives for hypoglycemia include no dizziness, headaches or pallor. Associated symptoms include blurred vision. Pertinent negatives for diabetes include no chest pain and no fatigue. Symptoms are stable. Her breakfast blood glucose is taken between 6-7 am. Her breakfast blood glucose range is generally 130-140 mg/dl.  Hyperlipidemia  This is a chronic problem. The problem is controlled. Pertinent negatives include no chest pain or shortness of breath. Current antihyperlipidemic treatment includes statins.  Hypertension  This is a chronic problem. The problem is unchanged. The problem is controlled. Associated symptoms include blurred vision. Pertinent negatives include no chest pain, headaches, palpitations or shortness of breath.  Cardiomyopathy - improving shortness of breath and stamina. Walking for exercise.  Has a defibrillator now.  Followed by the heart failure clinic.  Lab Results  Component Value Date   HGBA1C 6.7 (H) 11/28/2015     Review of Systems  Constitutional: Negative for chills, fatigue, fever and unexpected weight change.  HENT: Negative for hearing loss, sore throat and tinnitus.   Eyes: Positive for blurred vision and visual disturbance. Negative for pain and discharge.  Respiratory: Negative for cough, choking, chest tightness, shortness of breath and wheezing.   Cardiovascular: Negative for chest pain,  palpitations and leg swelling.  Gastrointestinal: Negative for abdominal pain, blood in stool, constipation and diarrhea.  Genitourinary: Negative for dysuria, hematuria, vaginal bleeding and vaginal discharge.  Musculoskeletal: Negative for arthralgias and gait problem.  Skin: Negative for color change, pallor and rash.  Neurological: Negative for dizziness, syncope, light-headedness and headaches.  Hematological: Negative for adenopathy.  Psychiatric/Behavioral: Negative for dysphoric mood and sleep disturbance.    Patient Active Problem List   Diagnosis Date Noted  . Non-ischemic cardiomyopathy (HCC) 07/14/2015  . Interstitial lung disease (HCC) 06/19/2015  . Menopause 05/15/2015  . Anxiety, mild 03/30/2015  . Controlled diabetes mellitus type II without complication (HCC) 03/30/2015  . Essential (primary) hypertension 03/30/2015  . Combined fat and carbohydrate induced hyperlipemia 03/30/2015  . Sinusitis, bacterial 03/30/2015    Prior to Admission medications   Medication Sig Start Date End Date Taking? Authorizing Provider  Alogliptin-Metformin HCl 12.5-500 MG TABS  05/17/15  Yes Historical Provider, MD  atorvastatin (LIPITOR) 20 MG tablet TAKE 1 TABLET AT BEDTIME 01/11/16  Yes Reubin Milan, MD  carvedilol (COREG) 3.125 MG tablet Take 1 tablet by mouth 2 (two) times daily. 10/24/15  Yes Historical Provider, MD  estradiol (ESTRACE) 0.5 MG tablet TAKE 1 TABLET (0.5 MG TOTAL) BY MOUTH DAILY. 11/28/15  Yes Reubin Milan, MD  furosemide (LASIX) 20 MG tablet Take 40 mg by mouth daily.  10/24/15  Yes Historical Provider, MD  glucose blood test strip  06/26/14  Yes Historical Provider, MD  lisinopril (PRINIVIL,ZESTRIL) 40 MG tablet Take 1 tablet by mouth daily. 10/31/15  Yes Historical Provider, MD  medroxyPROGESTERone (PROVERA) 2.5 MG tablet Take 1 tablet (2.5 mg total) by mouth daily. 11/28/15  Yes Reubin Milan, MD  metFORMIN (GLUCOPHAGE) 500 MG tablet Take 1 tablet (500 mg total)  by mouth 2 (two) times daily with a meal. 01/02/16  Yes Reubin Milan, MD  [facility] DELICA LANCETS 33G MISC  06/29/15  Yes Historical Provider, MD    No Known Allergies  Past Surgical History:  Procedure Laterality Date  . CARDIAC CATHETERIZATION N/A 07/12/2015   Procedure: Right and Left Heart Cath;  Surgeon: Alwyn Pea, MD;  Location: [facility] INVASIVE CV LAB;  Service: Cardiovascular;  Laterality: N/A;  . CARDIAC DEFIBRILLATOR PLACEMENT  01/2016  . CESAREAN SECTION    . GALLBLADDER SURGERY      Social History  Substance Use Topics  . Smoking status: Former Games developer  . Smokeless tobacco: Never Used  . Alcohol use 7.2 oz/week    12 Cans of beer per week     Comment: occasional     Medication list has been reviewed and updated.   Physical Exam  Constitutional: She is oriented to person, place, and time. She appears well-developed and well-nourished. No distress.  HENT:  Head: Normocephalic and atraumatic.  Right Ear: Tympanic membrane and ear canal normal.  Left Ear: Tympanic membrane and ear canal normal.  Nose: Right sinus exhibits no maxillary sinus tenderness. Left sinus exhibits no maxillary sinus tenderness.  Mouth/Throat: Uvula is midline and oropharynx is clear and moist.  Eyes: Conjunctivae and EOM are normal. Right eye exhibits no discharge. Left eye exhibits no discharge. No scleral icterus.  Neck: Normal range of motion. Carotid bruit is not present. No erythema present. No thyromegaly present.  Cardiovascular: Normal rate, regular rhythm, S1 normal, normal heart sounds and normal pulses.   Pulmonary/Chest: Effort normal. No respiratory distress. She has no wheezes. Right breast exhibits no mass, no nipple discharge, no skin change and no tenderness. Left breast exhibits no mass, no nipple discharge, no skin change and no tenderness.  Pacemaker incision healed in left upper chest  Abdominal: Soft. Bowel sounds are normal. There is no hepatosplenomegaly. There is  no tenderness. There is no CVA tenderness.  Musculoskeletal: Normal range of motion.  Lymphadenopathy:    She has no cervical adenopathy.    She has no axillary adenopathy.  Neurological: She is alert and oriented to person, place, and time. She has normal reflexes. No cranial nerve deficit or sensory deficit.  Skin: Skin is warm, dry and intact. No rash noted.  Psychiatric: She has a normal mood and affect. Her speech is normal and behavior is normal. Thought content normal.  Nursing note and vitals reviewed.   BP 138/80   Pulse 74   Resp 16   Ht 5\' 2"  (1.575 m)   Wt 151 lb (68.5 kg)   SpO2 98%   BMI 27.62 kg/m   Assessment and Plan: 1. Annual physical exam Continue exercise - POCT urinalysis dipstick  2. Breast cancer screening Scheduled mammogram at [facility]  3. Essential (primary) hypertension controlled - CBC with Differential/Platelet  4. Non-ischemic cardiomyopathy (HCC) Doing well on current medications Continue heart failure clinic follow up  5. Controlled type 2 diabetes mellitus without complication, without long-term current use of insulin (HCC) Doing well on current therapy - goal A1C 6.5 - Comprehensive metabolic panel - Hemoglobin A1c - TSH - Microalbumin / creatinine urine ratio  6. Combined fat and carbohydrate induced hyperlipemia On statin therapy - Lipid panel  7. Need for hepatitis C screening test - Hepatitis C antibody  8. Low magnesium level Check level  and advise on supplementation - Magnesium  9. Menopause Doing well   Bari EdwardLaura Farron Lafond, MD [facility]  07/02/2016

## 2016-07-03 ENCOUNTER — Encounter: Payer: Self-pay | Admitting: Internal Medicine

## 2016-07-03 DIAGNOSIS — D72829 Elevated white blood cell count, unspecified: Secondary | ICD-10-CM | POA: Insufficient documentation

## 2016-07-03 LAB — COMPREHENSIVE METABOLIC PANEL
A/G RATIO: 1.5 (ref 1.2–2.2)
ALBUMIN: 4.8 g/dL (ref 3.5–5.5)
ALK PHOS: 130 IU/L — AB (ref 39–117)
ALT: 13 IU/L (ref 0–32)
AST: 11 IU/L (ref 0–40)
BILIRUBIN TOTAL: 0.6 mg/dL (ref 0.0–1.2)
BUN / CREAT RATIO: 16 (ref 9–23)
BUN: 14 mg/dL (ref 6–24)
CHLORIDE: 92 mmol/L — AB (ref 96–106)
CO2: 26 mmol/L (ref 18–29)
Calcium: 10.2 mg/dL (ref 8.7–10.2)
Creatinine, Ser: 0.9 mg/dL (ref 0.57–1.00)
GFR calc Af Amer: 83 mL/min/{1.73_m2} (ref >59)
GFR calc non Af Amer: 72 mL/min/{1.73_m2} (ref >59)
GLOBULIN, TOTAL: 3.2 g/dL (ref 1.5–4.5)
GLUCOSE: 170 mg/dL — AB (ref 65–99)
POTASSIUM: 5 mmol/L (ref 3.5–5.2)
SODIUM: 137 mmol/L (ref 134–144)
Total Protein: 8 g/dL (ref 6.0–8.5)

## 2016-07-03 LAB — CBC WITH DIFFERENTIAL/PLATELET
BASOS ABS: 0.1 10*3/uL (ref 0.0–0.2)
Basos: 0 %
EOS (ABSOLUTE): 0.5 10*3/uL — ABNORMAL HIGH (ref 0.0–0.4)
Eos: 4 %
HEMATOCRIT: 40 % (ref 34.0–46.6)
HEMOGLOBIN: 13.3 g/dL (ref 11.1–15.9)
Immature Grans (Abs): 0 10*3/uL (ref 0.0–0.1)
Immature Granulocytes: 0 %
LYMPHS ABS: 2.4 10*3/uL (ref 0.7–3.1)
Lymphs: 18 %
MCH: 28.5 pg (ref 26.6–33.0)
MCHC: 33.3 g/dL (ref 31.5–35.7)
MCV: 86 fL (ref 79–97)
MONOCYTES: 4 %
Monocytes Absolute: 0.6 10*3/uL (ref 0.1–0.9)
NEUTROS ABS: 10 10*3/uL — AB (ref 1.4–7.0)
Neutrophils: 74 %
Platelets: 354 10*3/uL (ref 150–379)
RBC: 4.66 x10E6/uL (ref 3.77–5.28)
RDW: 14 % (ref 12.3–15.4)
WBC: 13.6 10*3/uL — ABNORMAL HIGH (ref 3.4–10.8)

## 2016-07-03 LAB — LIPID PANEL
CHOL/HDL RATIO: 5.4 ratio — AB (ref 0.0–4.4)
Cholesterol, Total: 177 mg/dL (ref 100–199)
HDL: 33 mg/dL — ABNORMAL LOW (ref >39)
LDL CALC: 89 mg/dL (ref 0–99)
Triglycerides: 273 mg/dL — ABNORMAL HIGH (ref 0–149)
VLDL CHOLESTEROL CAL: 55 mg/dL — AB (ref 5–40)

## 2016-07-03 LAB — HEMOGLOBIN A1C
Est. average glucose Bld gHb Est-mCnc: 169 mg/dL
Hgb A1c MFr Bld: 7.5 % — ABNORMAL HIGH (ref 4.8–5.6)

## 2016-07-03 LAB — TSH: TSH: 1.1 u[IU]/mL (ref 0.450–4.500)

## 2016-07-03 LAB — MICROALBUMIN / CREATININE URINE RATIO: CREATININE, UR: 59.7 mg/dL

## 2016-07-03 LAB — MAGNESIUM: MAGNESIUM: 1.8 mg/dL (ref 1.6–2.3)

## 2016-07-03 LAB — HEPATITIS C ANTIBODY: Hep C Virus Ab: <0.1 s/co ratio (ref 0.0–0.9)

## 2016-07-10 ENCOUNTER — Encounter: Payer: Self-pay | Admitting: Internal Medicine

## 2016-08-19 DIAGNOSIS — I5022 Chronic systolic (congestive) heart failure: Secondary | ICD-10-CM | POA: Diagnosis not present

## 2016-08-27 ENCOUNTER — Other Ambulatory Visit: Payer: Self-pay | Admitting: Internal Medicine

## 2016-08-29 DIAGNOSIS — Z95810 Presence of automatic (implantable) cardiac defibrillator: Secondary | ICD-10-CM | POA: Diagnosis not present

## 2016-08-29 DIAGNOSIS — I428 Other cardiomyopathies: Secondary | ICD-10-CM | POA: Diagnosis not present

## 2016-08-29 DIAGNOSIS — I5022 Chronic systolic (congestive) heart failure: Secondary | ICD-10-CM | POA: Diagnosis not present

## 2016-10-08 ENCOUNTER — Encounter: Payer: Self-pay | Admitting: Internal Medicine

## 2016-11-05 ENCOUNTER — Ambulatory Visit: Payer: Self-pay | Admitting: Internal Medicine

## 2016-11-06 ENCOUNTER — Other Ambulatory Visit: Payer: Self-pay

## 2016-11-07 ENCOUNTER — Telehealth: Payer: Self-pay

## 2016-11-07 NOTE — Telephone Encounter (Signed)
Called pt and informed her of Alogliptin-Metformin [facility] being approved after Prior Auth.

## 2016-11-13 ENCOUNTER — Encounter: Payer: Self-pay | Admitting: Internal Medicine

## 2016-11-18 DIAGNOSIS — I5022 Chronic systolic (congestive) heart failure: Secondary | ICD-10-CM | POA: Diagnosis not present

## 2016-11-19 ENCOUNTER — Encounter: Payer: Self-pay | Admitting: Internal Medicine

## 2016-11-19 ENCOUNTER — Ambulatory Visit (INDEPENDENT_AMBULATORY_CARE_PROVIDER_SITE_OTHER): Payer: BLUE CROSS/BLUE SHIELD | Admitting: Internal Medicine

## 2016-11-19 VITALS — BP 142/76 | HR 82 | Ht 62.0 in | Wt 157.0 lb

## 2016-11-19 DIAGNOSIS — I10 Essential (primary) hypertension: Secondary | ICD-10-CM

## 2016-11-19 DIAGNOSIS — I428 Other cardiomyopathies: Secondary | ICD-10-CM

## 2016-11-19 DIAGNOSIS — E119 Type 2 diabetes mellitus without complications: Secondary | ICD-10-CM | POA: Diagnosis not present

## 2016-11-19 MED ORDER — GLUCOSE BLOOD VI STRP: ORAL_STRIP | 12 refills | Status: AC

## 2016-11-19 NOTE — Patient Instructions (Signed)
Victoza - 0.6 daily for 7-14 days then increase to 1.2 daily for 7-14 days then 1.8 daily.

## 2016-11-19 NOTE — Progress Notes (Signed)
Date:  11/19/2016   Name:  Lori Brewer   DOB:  08/29/1961   MRN:  [id number]   Chief Complaint: Diabetes (Needs One Touch Ultra test strips. Unable to check at home due to no test strips. 2 weeks ago- 168.) Diabetes  She presents for her follow-up diabetic visit. She has type 2 diabetes mellitus. Her disease course has been stable. Pertinent negatives for hypoglycemia include no headaches or tremors. Pertinent negatives for diabetes include no chest pain, no fatigue, no polydipsia and no polyuria. Symptoms are stable. Current diabetic treatment includes oral agent (triple therapy). Her weight is increasing steadily. She is following a generally healthy diet. Her breakfast blood glucose is taken between 6-7 am. Her breakfast blood glucose range is generally 130-140 mg/dl.  Hypertension  This is a chronic problem. The current episode started more than 1 year ago. The problem is controlled. Associated symptoms include shortness of breath. Pertinent negatives include no chest pain, headaches or palpitations. Hypertensive end-organ damage includes heart failure.  Sometimes glucose up to 250.  Lab Results  Component Value Date   HGBA1C 7.5 (H) 07/02/2016    Review of Systems  Constitutional: Positive for unexpected weight change. Negative for appetite change, fatigue and fever.  HENT: Negative for tinnitus and trouble swallowing.   Eyes: Negative for visual disturbance.  Respiratory: Positive for shortness of breath. Negative for cough and chest tightness.   Cardiovascular: Negative for chest pain, palpitations and leg swelling.  Gastrointestinal: Negative for abdominal pain.  Endocrine: Negative for polydipsia and polyuria.  Genitourinary: Negative for dysuria and hematuria.  Musculoskeletal: Negative for arthralgias.  Neurological: Negative for tremors, numbness and headaches.  Psychiatric/Behavioral: Negative for dysphoric mood.    Patient Active Problem List   Diagnosis Date Noted    . Elevated WBC count 07/03/2016  . Non-ischemic cardiomyopathy (HCC) 07/14/2015  . Interstitial lung disease (HCC) 06/19/2015  . Menopause 05/15/2015  . Anxiety, mild 03/30/2015  . Controlled diabetes mellitus type II without complication (HCC) 03/30/2015  . Essential (primary) hypertension 03/30/2015  . Combined fat and carbohydrate induced hyperlipemia 03/30/2015    Prior to Admission medications   Medication Sig Start Date End Date Taking? Authorizing Provider  Alogliptin-Metformin HCl 12.5-500 MG TABS TAKE 1 TABLET TWICE A DAY 08/28/16  Yes Reubin Milan, MD  atorvastatin (LIPITOR) 20 MG tablet TAKE 1 TABLET AT BEDTIME 01/11/16  Yes Reubin Milan, MD  carvedilol (COREG) 3.125 MG tablet Take 1 tablet by mouth 2 (two) times daily. 10/24/15  Yes Historical Provider, MD  estradiol (ESTRACE) 0.5 MG tablet TAKE 1 TABLET (0.5 MG TOTAL) BY MOUTH DAILY. 11/28/15  Yes Reubin Milan, MD  furosemide (LASIX) 20 MG tablet Take 40 mg by mouth daily.  10/24/15  Yes Historical Provider, MD  glucose blood test strip  06/26/14  Yes Historical Provider, MD  lisinopril (PRINIVIL,ZESTRIL) 40 MG tablet Take 1 tablet by mouth daily. 10/31/15  Yes Historical Provider, MD  medroxyPROGESTERone (PROVERA) 2.5 MG tablet Take 1 tablet (2.5 mg total) by mouth daily. 11/28/15  Yes Reubin Milan, MD  [facility] DELICA LANCETS 33G MISC  06/29/15  Yes Historical Provider, MD    Allergies  Allergen Reactions  . Januvia [Sitagliptin] Nausea Only    Past Surgical History:  Procedure Laterality Date  . CARDIAC CATHETERIZATION N/A 07/12/2015   Procedure: Right and Left Heart Cath;  Surgeon: Alwyn Pea, MD;  Location: [facility] INVASIVE CV LAB;  Service: Cardiovascular;  Laterality: N/A;  . CARDIAC  DEFIBRILLATOR PLACEMENT  01/2016  . CESAREAN SECTION    . GALLBLADDER SURGERY      Social History  Substance Use Topics  . Smoking status: Former Games developer  . Smokeless tobacco: Never Used  . Alcohol use 7.2 oz/week     12 Cans of beer per week     Comment: occasional     Medication list has been reviewed and updated.   Physical Exam  Constitutional: She is oriented to person, place, and time. She appears well-developed. No distress.  HENT:  Head: Normocephalic and atraumatic.  Neck: Normal range of motion. Neck supple.  Cardiovascular: Normal rate and regular rhythm.  Exam reveals distant heart sounds.   Murmur heard.  Systolic murmur is present with a grade of 1/6  Pulmonary/Chest: Effort normal and breath sounds normal. No respiratory distress. She has no wheezes. She has no rhonchi.  Abdominal: Soft. Bowel sounds are normal.  Musculoskeletal: She exhibits no edema.  Neurological: She is alert and oriented to person, place, and time.  Skin: Skin is warm and dry. No rash noted.  Psychiatric: She has a normal mood and affect. Her speech is normal and behavior is normal. Thought content normal.  Nursing note and vitals reviewed.   BP (!) 142/76 (BP Location: Right Arm, Patient Position: Sitting, Cuff Size: Normal)   Pulse 82   Ht 5\' 2"  (1.575 m)   Wt 157 lb (71.2 kg)   BMI 28.72 kg/m   Assessment and Plan: 1. Essential (primary) hypertension controlled  2. Non-ischemic cardiomyopathy (HCC) improved  3. Controlled type 2 diabetes mellitus without complication, without long-term current use of insulin (HCC) Worsening with recent weight gain Sample of Victoza and instructions given - if A1C much higher will instruct to begin treatment Continue other medications for now - may be able to stop the additional metformin - Hemoglobin A1c - glucose blood test strip; Use as instructed  Dispense: 100 each; Refill: 12   Meds ordered this encounter  Medications  . glucose blood test strip    Sig: Use as instructed    Dispense:  100 each    Refill:  12    Dx E11.9    Bari Edward, MD [facility]  11/19/2016

## 2016-11-20 ENCOUNTER — Other Ambulatory Visit: Payer: Self-pay | Admitting: Internal Medicine

## 2016-11-20 ENCOUNTER — Telehealth: Payer: Self-pay

## 2016-11-20 LAB — HEMOGLOBIN A1C
Est. average glucose Bld gHb Est-mCnc: 200 mg/dL
Hgb A1c MFr Bld: 8.6 % — ABNORMAL HIGH (ref 4.8–5.6)

## 2016-11-20 MED ORDER — LIRAGLUTIDE 18 MG/3ML ~~LOC~~ SOPN: 1.8000 mg | PEN_INJECTOR | Freq: Every day | SUBCUTANEOUS | 1 refills | Status: DC

## 2016-11-20 NOTE — Telephone Encounter (Signed)
It probably needs a PA.  I can send in the Rx and see if it triggers paperwork.

## 2016-11-20 NOTE — Telephone Encounter (Signed)
Pt agrees .

## 2016-11-20 NOTE — Telephone Encounter (Signed)
Pt called stating she checked [employer] and Insurance does not cover Victoza. Advice please?

## 2016-11-21 ENCOUNTER — Encounter: Payer: Self-pay | Admitting: Internal Medicine

## 2016-11-21 ENCOUNTER — Other Ambulatory Visit: Payer: Self-pay | Admitting: Internal Medicine

## 2016-11-21 MED ORDER — EXENATIDE ER 2 MG/0.85ML ~~LOC~~ AUIJ: 2.0000 mg | AUTO-INJECTOR | SUBCUTANEOUS | 3 refills | Status: DC

## 2016-12-02 ENCOUNTER — Encounter: Payer: Self-pay | Admitting: Internal Medicine

## 2016-12-02 ENCOUNTER — Other Ambulatory Visit: Payer: Self-pay | Admitting: Internal Medicine

## 2016-12-02 DIAGNOSIS — IMO0001 Reserved for inherently not codable concepts without codable children: Secondary | ICD-10-CM

## 2016-12-02 DIAGNOSIS — E1165 Type 2 diabetes mellitus with hyperglycemia: Principal | ICD-10-CM

## 2016-12-25 ENCOUNTER — Other Ambulatory Visit: Payer: Self-pay | Admitting: Internal Medicine

## 2016-12-25 DIAGNOSIS — I428 Other cardiomyopathies: Secondary | ICD-10-CM

## 2017-01-21 DIAGNOSIS — E1165 Type 2 diabetes mellitus with hyperglycemia: Secondary | ICD-10-CM | POA: Diagnosis not present

## 2017-01-21 DIAGNOSIS — I10 Essential (primary) hypertension: Secondary | ICD-10-CM | POA: Diagnosis not present

## 2017-02-26 DIAGNOSIS — I428 Other cardiomyopathies: Secondary | ICD-10-CM | POA: Diagnosis not present

## 2017-02-26 DIAGNOSIS — R0602 Shortness of breath: Secondary | ICD-10-CM | POA: Diagnosis not present

## 2017-02-26 DIAGNOSIS — I5022 Chronic systolic (congestive) heart failure: Secondary | ICD-10-CM | POA: Diagnosis not present

## 2017-02-26 DIAGNOSIS — Z95810 Presence of automatic (implantable) cardiac defibrillator: Secondary | ICD-10-CM | POA: Diagnosis not present

## 2017-03-05 ENCOUNTER — Other Ambulatory Visit: Payer: Self-pay | Admitting: Internal Medicine

## 2017-03-17 DIAGNOSIS — E1165 Type 2 diabetes mellitus with hyperglycemia: Secondary | ICD-10-CM | POA: Diagnosis not present

## 2017-03-24 DIAGNOSIS — E1165 Type 2 diabetes mellitus with hyperglycemia: Secondary | ICD-10-CM | POA: Diagnosis not present

## 2017-03-24 DIAGNOSIS — Z794 Long term (current) use of insulin: Secondary | ICD-10-CM | POA: Diagnosis not present

## 2017-05-15 ENCOUNTER — Encounter: Payer: Self-pay | Admitting: Internal Medicine

## 2017-05-15 ENCOUNTER — Ambulatory Visit (INDEPENDENT_AMBULATORY_CARE_PROVIDER_SITE_OTHER): Payer: BLUE CROSS/BLUE SHIELD | Admitting: Internal Medicine

## 2017-05-15 VITALS — BP 148/74 | HR 82 | Ht 62.0 in | Wt 152.8 lb

## 2017-05-15 DIAGNOSIS — I428 Other cardiomyopathies: Secondary | ICD-10-CM

## 2017-05-15 DIAGNOSIS — J3089 Other allergic rhinitis: Secondary | ICD-10-CM | POA: Diagnosis not present

## 2017-05-15 DIAGNOSIS — E782 Mixed hyperlipidemia: Secondary | ICD-10-CM

## 2017-05-15 DIAGNOSIS — I10 Essential (primary) hypertension: Secondary | ICD-10-CM

## 2017-05-15 DIAGNOSIS — E119 Type 2 diabetes mellitus without complications: Secondary | ICD-10-CM | POA: Diagnosis not present

## 2017-05-15 DIAGNOSIS — F411 Generalized anxiety disorder: Secondary | ICD-10-CM | POA: Diagnosis not present

## 2017-05-15 DIAGNOSIS — Z794 Long term (current) use of insulin: Secondary | ICD-10-CM | POA: Diagnosis not present

## 2017-05-15 MED ORDER — NEOMYCIN-POLYMYXIN-DEXAMETH 3.5-10000-0.1 OP SUSP: 2.0000 [drp] | Freq: Four times a day (QID) | OPHTHALMIC | 0 refills | Status: DC

## 2017-05-15 MED ORDER — ESCITALOPRAM OXALATE 10 MG PO TABS: 10.0000 mg | ORAL_TABLET | Freq: Every day | ORAL | 2 refills | Status: DC

## 2017-05-15 NOTE — Patient Instructions (Signed)
Lexapro 10 mg - 1/2 tablet daily for 8 days then a whole tablet - take at night.

## 2017-05-15 NOTE — Progress Notes (Signed)
Date:  05/15/2017   Name:  Lori Brewer   DOB:  [birth year]   MRN:  [id number]   Chief Complaint: Hypertension and Hyperlipidemia Hypertension  This is a chronic problem. The problem is unchanged. The problem is controlled. Associated symptoms include anxiety. Pertinent negatives include no chest pain, headaches, palpitations or shortness of breath. Past treatments include beta blockers, ACE inhibitors and diuretics. The current treatment provides significant improvement.  Hyperlipidemia  This is a chronic problem. Pertinent negatives include no chest pain or shortness of breath. Current antihyperlipidemic treatment includes statins.  Anxiety  Presents for initial visit. Symptoms include excessive worry and insomnia. Patient reports no chest pain, dizziness, palpitations or shortness of breath. Symptoms occur most days. The symptoms are aggravated by family issues and work stress. The quality of sleep is poor.    Non ischemic cardiomyopathy - followed by Dr. Juliann Pares.  She has been stable, BP is always high in the clinic.  No chest pain.  Stamina stable.  AICD did fire last month with minimal sx.  Felt secondary stress.  DM - now followed by Dr. Tedd Sias.   On [place]. Last A1C 6.8.   Sinus congestion/itchy eyes - over the past week with irritation of the eyes, post nasal drainage, puffy face.  Some relief with zyrtec.  Review of Systems  Constitutional: Negative for chills, fatigue and fever.  HENT: Positive for congestion, facial swelling and sinus pressure. Negative for postnasal drip.   Respiratory: Negative for cough, chest tightness, shortness of breath and wheezing.   Cardiovascular: Negative for chest pain, palpitations and leg swelling.  Gastrointestinal: Negative for abdominal pain.  Musculoskeletal: Negative for arthralgias.  Neurological: Negative for dizziness and headaches.  Psychiatric/Behavioral: The patient has insomnia.     Patient Active Problem List   Diagnosis Date Noted  . Elevated WBC count 07/03/2016  . Non-ischemic cardiomyopathy (HCC) 07/14/2015  . Interstitial lung disease (HCC) 06/19/2015  . Menopause 05/15/2015  . Anxiety, mild 03/30/2015  . Controlled type 2 diabetes mellitus without complication, with long-term current use of insulin (HCC) 03/30/2015  . Essential (primary) hypertension 03/30/2015  . Combined fat and carbohydrate induced hyperlipemia 03/30/2015    Prior to Admission medications   Medication Sig Start Date End Date Taking? Authorizing Provider  atorvastatin (LIPITOR) 20 MG tablet TAKE 1 TABLET AT BEDTIME 03/05/17  Yes Reubin Milan, MD  carvedilol (COREG) 3.125 MG tablet Take 1 tablet by mouth 2 (two) times daily. 10/24/15  Yes [provider]  estradiol (ESTRACE) 0.5 MG tablet TAKE 1 TABLET DAILY 12/25/16  Yes Reubin Milan, MD  Exenatide ER (BYDUREON BCISE) 2 MG/0.85ML AUIJ Inject 2 mg into the skin once a week. 11/21/16  Yes Reubin Milan, MD  furosemide (LASIX) 20 MG tablet Take 40 mg by mouth daily.  10/24/15  Yes [provider]  glucose blood test strip Use as instructed 11/19/16  Yes Reubin Milan, MD  insulin degludec (TRESIBA FLEXTOUCH) 100 UNIT/ML SOPN FlexTouch Pen Inject 20 Units into the skin daily at 10 pm.   Yes [provider]  lisinopril (PRINIVIL,ZESTRIL) 40 MG tablet Take 1 tablet by mouth daily. 10/31/15  Yes [provider]  medroxyPROGESTERone (PROVERA) 2.5 MG tablet TAKE 1 TABLET DAILY 12/25/16  Yes Reubin Milan, MD  [facility] DELICA LANCETS 33G MISC  06/29/15  Yes [provider]    Allergies  Allergen Reactions  . Januvia [Sitagliptin] Nausea Only    Past Surgical History:  Procedure Laterality Date  . CARDIAC CATHETERIZATION N/A 07/12/2015   Procedure: Right and Left Heart Cath;  Surgeon: Alwyn Pea, MD;  Location: [facility] INVASIVE CV LAB;  Service: Cardiovascular;  Laterality: N/A;  . CARDIAC DEFIBRILLATOR PLACEMENT   01/2016  . CESAREAN SECTION    . GALLBLADDER SURGERY      Social History  Substance Use Topics  . Smoking status: Former Games developer  . Smokeless tobacco: Never Used  . Alcohol use 7.2 oz/week    12 Cans of beer per week     Comment: occasional     Medication list has been reviewed and updated.  PHQ 2/9 Scores 07/02/2016  PHQ - 2 Score 0    Physical Exam  Constitutional: She is oriented to person, place, and time. She appears well-developed. No distress.  HENT:  Head: Normocephalic and atraumatic.  Neck: Normal range of motion. Neck supple.  Cardiovascular: Normal rate, regular rhythm and normal heart sounds.  Exam reveals no gallop.   No murmur heard. Pulmonary/Chest: Effort normal and breath sounds normal. No respiratory distress. She has no wheezes.  Musculoskeletal: She exhibits no edema or tenderness.  Neurological: She is alert and oriented to person, place, and time.  Skin: Skin is warm and dry. No rash noted.  Psychiatric: She has a normal mood and affect. Her behavior is normal. Thought content normal.  Nursing note and vitals reviewed.   BP (!) 148/74   Pulse 82   Ht 5\' 2"  (1.575 m)   Wt 152 lb 12.8 oz (69.3 kg)   SpO2 99%   BMI 27.95 kg/m   Assessment and Plan: 1. Non-ischemic cardiomyopathy (HCC) S/p AICD Follow up with cardiology annually  2. Essential (primary) hypertension Controlled at home  3. Uncontrolled type 2 diabetes mellitus without complication, without long-term current use of insulin (HCC) Much improved under Endocrine care  4. Combined fat and carbohydrate induced hyperlipemia On statin therapy  5. Environmental and seasonal allergies Continue zyrtec, add flonase - neomycin-polymyxin b-dexamethasone (MAXITROL) 3.5-10000-0.1 SUSP; Place 2 drops into both eyes every 6 (six) hours.  Dispense: 5 mL; Refill: 0  6. Generalized anxiety disorder Begin medication - escitalopram (LEXAPRO) 10 MG tablet; Take 1 tablet (10 mg total) by mouth  daily.  Dispense: 30 tablet; Refill: 2   Meds ordered this encounter  Medications  . escitalopram (LEXAPRO) 10 MG tablet    Sig: Take 1 tablet (10 mg total) by mouth daily.    Dispense:  30 tablet    Refill:  2  . neomycin-polymyxin b-dexamethasone (MAXITROL) 3.5-10000-0.1 SUSP    Sig: Place 2 drops into both eyes every 6 (six) hours.    Dispense:  5 mL    Refill:  0    Partially dictated using Animal nutritionist. Any errors are unintentional.  Bari Edward, MD [facility]  05/15/2017

## 2017-05-19 DIAGNOSIS — I481 Persistent atrial fibrillation: Secondary | ICD-10-CM | POA: Diagnosis not present

## 2017-06-08 ENCOUNTER — Encounter: Payer: Self-pay | Admitting: Internal Medicine

## 2017-06-08 NOTE — Telephone Encounter (Signed)
Discussing medications in MyChart msg. Please Advise.

## 2017-06-23 ENCOUNTER — Other Ambulatory Visit: Payer: Self-pay | Admitting: Internal Medicine

## 2017-06-23 ENCOUNTER — Encounter: Payer: Self-pay | Admitting: Internal Medicine

## 2017-06-23 DIAGNOSIS — F411 Generalized anxiety disorder: Secondary | ICD-10-CM

## 2017-06-23 MED ORDER — ESCITALOPRAM OXALATE 10 MG PO TABS: 10.0000 mg | ORAL_TABLET | Freq: Every day | ORAL | 3 refills | Status: DC

## 2017-06-23 NOTE — Telephone Encounter (Signed)
Patient refill request -

## 2017-06-24 DIAGNOSIS — Z794 Long term (current) use of insulin: Secondary | ICD-10-CM | POA: Diagnosis not present

## 2017-06-24 DIAGNOSIS — E1165 Type 2 diabetes mellitus with hyperglycemia: Secondary | ICD-10-CM | POA: Diagnosis not present

## 2017-06-24 LAB — HEMOGLOBIN A1C: Hemoglobin A1C: 6.8

## 2017-07-01 DIAGNOSIS — E1165 Type 2 diabetes mellitus with hyperglycemia: Secondary | ICD-10-CM | POA: Diagnosis not present

## 2017-07-01 DIAGNOSIS — Z794 Long term (current) use of insulin: Secondary | ICD-10-CM | POA: Diagnosis not present

## 2017-08-18 DIAGNOSIS — I5022 Chronic systolic (congestive) heart failure: Secondary | ICD-10-CM | POA: Diagnosis not present

## 2017-09-28 ENCOUNTER — Encounter: Payer: Self-pay | Admitting: Internal Medicine

## 2017-09-29 DIAGNOSIS — E119 Type 2 diabetes mellitus without complications: Secondary | ICD-10-CM | POA: Diagnosis not present

## 2017-11-15 ENCOUNTER — Other Ambulatory Visit: Payer: Self-pay | Admitting: Internal Medicine

## 2017-11-15 DIAGNOSIS — F411 Generalized anxiety disorder: Secondary | ICD-10-CM

## 2017-12-10 ENCOUNTER — Encounter: Payer: Self-pay | Admitting: Internal Medicine

## 2017-12-10 ENCOUNTER — Ambulatory Visit (INDEPENDENT_AMBULATORY_CARE_PROVIDER_SITE_OTHER): Payer: BLUE CROSS/BLUE SHIELD | Admitting: Internal Medicine

## 2017-12-10 ENCOUNTER — Other Ambulatory Visit: Payer: Self-pay | Admitting: Internal Medicine

## 2017-12-10 VITALS — BP 122/82 | HR 76 | Resp 16 | Ht 62.0 in | Wt 160.0 lb

## 2017-12-10 DIAGNOSIS — F419 Anxiety disorder, unspecified: Secondary | ICD-10-CM

## 2017-12-10 DIAGNOSIS — I10 Essential (primary) hypertension: Secondary | ICD-10-CM

## 2017-12-10 DIAGNOSIS — I428 Other cardiomyopathies: Secondary | ICD-10-CM | POA: Diagnosis not present

## 2017-12-10 DIAGNOSIS — E119 Type 2 diabetes mellitus without complications: Secondary | ICD-10-CM

## 2017-12-10 DIAGNOSIS — Z95810 Presence of automatic (implantable) cardiac defibrillator: Secondary | ICD-10-CM | POA: Diagnosis not present

## 2017-12-10 DIAGNOSIS — E785 Hyperlipidemia, unspecified: Secondary | ICD-10-CM | POA: Diagnosis not present

## 2017-12-10 DIAGNOSIS — Z1211 Encounter for screening for malignant neoplasm of colon: Secondary | ICD-10-CM

## 2017-12-10 DIAGNOSIS — J849 Interstitial pulmonary disease, unspecified: Secondary | ICD-10-CM

## 2017-12-10 DIAGNOSIS — Z794 Long term (current) use of insulin: Secondary | ICD-10-CM | POA: Diagnosis not present

## 2017-12-10 DIAGNOSIS — Z1239 Encounter for other screening for malignant neoplasm of breast: Secondary | ICD-10-CM

## 2017-12-10 DIAGNOSIS — Z0000 Encounter for general adult medical examination without abnormal findings: Secondary | ICD-10-CM

## 2017-12-10 DIAGNOSIS — E1169 Type 2 diabetes mellitus with other specified complication: Secondary | ICD-10-CM

## 2017-12-10 DIAGNOSIS — Z1231 Encounter for screening mammogram for malignant neoplasm of breast: Secondary | ICD-10-CM

## 2017-12-10 DIAGNOSIS — Z23 Encounter for immunization: Secondary | ICD-10-CM | POA: Diagnosis not present

## 2017-12-10 LAB — POCT URINALYSIS DIPSTICK
Bilirubin, UA: NEGATIVE
Blood, UA: NEGATIVE
GLUCOSE UA: NEGATIVE
Ketones, UA: NEGATIVE
LEUKOCYTES UA: NEGATIVE
Nitrite, UA: NEGATIVE
Protein, UA: NEGATIVE
SPEC GRAV UA: 1.015 (ref 1.010–1.025)
Urobilinogen, UA: 0.2 E.U./dL
pH, UA: 5 (ref 5.0–8.0)

## 2017-12-10 MED ORDER — BUPROPION HCL ER (XL) 150 MG PO TB24: 150.0000 mg | ORAL_TABLET | Freq: Every day | ORAL | 1 refills | Status: DC

## 2017-12-10 NOTE — Patient Instructions (Addendum)
Take 1/2 Lexapro daily for one week then stop. Once Lexapro is stopped, begin Bupropion.  Pneumococcal Polysaccharide Vaccine: What You Need to Know 1. Why get vaccinated? Vaccination can protect older adults (and some children and younger adults) from pneumococcal disease. Pneumococcal disease is caused by bacteria that can spread from person to person through close contact. It can cause ear infections, and it can also lead to more serious infections of the:  Lungs (pneumonia),  Blood (bacteremia), and  Covering of the brain and spinal cord (meningitis). Meningitis can cause deafness and brain damage, and it can be fatal.  Anyone can get pneumococcal disease, but children under 51 years of age, people with certain medical conditions, adults over 5 years of age, and cigarette smokers are at the highest risk. About 18,000 older adults die each year from pneumococcal disease in the [place]. Treatment of pneumococcal infections with penicillin and other drugs used to be more effective. But some strains of the disease have become resistant to these drugs. This makes prevention of the disease, through vaccination, even more important. 2. Pneumococcal polysaccharide vaccine (PPSV23) Pneumococcal polysaccharide vaccine (PPSV23) protects against 23 types of pneumococcal bacteria. It will not prevent all pneumococcal disease. PPSV23 is recommended for:  All adults 32 years of age and older,  Anyone 2 through 57 years of age with certain long-term health problems,  Anyone 2 through 57 years of age with a weakened immune system,  Adults 26 through 57 years of age who smoke cigarettes or have asthma.  Most people need only one dose of PPSV. A second dose is recommended for certain high-risk groups. People 89 and older should get a dose even if they have gotten one or more doses of the vaccine before they turned 65. Your healthcare provider can give you more information about these  recommendations. Most healthy adults develop protection within 2 to 3 weeks of getting the shot. 3. Some people should not get this vaccine  Anyone who has had a life-threatening allergic reaction to PPSV should not get another dose.  Anyone who has a severe allergy to any component of PPSV should not receive it. Tell your provider if you have any severe allergies.  Anyone who is moderately or severely ill when the shot is scheduled may be asked to wait until they recover before getting the vaccine. Someone with a mild illness can usually be vaccinated.  Children less than 38 years of age should not receive this vaccine.  There is no evidence that PPSV is harmful to either a pregnant woman or to her fetus. However, as a precaution, women who need the vaccine should be vaccinated before becoming pregnant, if possible. 4. Risks of a vaccine reaction With any medicine, including vaccines, there is a chance of side effects. These are usually mild and go away on their own, but serious reactions are also possible. About half of people who get PPSV have mild side effects, such as redness or pain where the shot is given, which go away within about two days. Less than 1 out of 100 people develop a fever, muscle aches, or more severe local reactions. Problems that could happen after any vaccine:  People sometimes faint after a medical procedure, including vaccination. Sitting or lying down for about 15 minutes can help prevent fainting, and injuries caused by a fall. Tell your doctor if you feel dizzy, or have vision changes or ringing in the ears.  Some people get severe pain in the shoulder and  have difficulty moving the arm where a shot was given. This happens very rarely.  Any medication can cause a severe allergic reaction. Such reactions from a vaccine are very rare, estimated at about 1 in a million doses, and would happen within a few minutes to a few hours after the vaccination. As with any  medicine, there is a very remote chance of a vaccine causing a serious injury or death. The safety of vaccines is always being monitored. For more information, visit: http://floyd.org/ 5. What if there is a serious reaction? What should I look for? Look for anything that concerns you, such as signs of a severe allergic reaction, very high fever, or unusual behavior. Signs of a severe allergic reaction can include hives, swelling of the face and throat, difficulty breathing, a fast heartbeat, dizziness, and weakness. These would usually start a few minutes to a few hours after the vaccination. What should I do? If you think it is a severe allergic reaction or other emergency that can't wait, call 9-1-1 or get to the nearest hospital. Otherwise, call your doctor. Afterward, the reaction should be reported to the Vaccine Adverse Event Reporting System (VAERS). Your doctor might file this report, or you can do it yourself through the VAERS web site at www.vaers.LAgents.no, or by calling 1-260-365-3192. VAERS does not give medical advice. 6. How can I learn more?  Ask your doctor. He or she can give you the vaccine package insert or suggest other sources of information.  Call your local or state health department.  Contact the Centers for Disease Control and Prevention (CDC): ? Call 650 773 8520 (1-800-CDC-INFO) or ? Visit CDC's website at PicCapture.uy CDC Pneumococcal Polysaccharide Vaccine VIS (12/30/13) This information is not intended to replace advice given to you by your health care provider. Make sure you discuss any questions you have with your health care provider. Document Released: 06/22/2006 Document Revised: 05/15/2016 Document Reviewed: 05/15/2016 Elsevier Interactive Patient Education  2017 [employer].

## 2017-12-10 NOTE — Progress Notes (Signed)
Date:  12/10/2017   Name:  Lori Brewer   DOB:  02-06-1961   MRN:  [id number]   Chief Complaint: Annual Exam (Diabetes good 6.7 A1c and BS 120-140 ) Lori Brewer is a 57 y.o. female who presents today for her Complete Annual Exam. She feels well. She reports exercising walking. She reports she is sleeping fairly well.  She is due for Mammogram, colonoscopy and PPV-23. She declines colonoscopy at this time.  Will need Pap smear next year.  She will schedule mammogram and agrees to PPV-23 today.  Hypertension  Associated symptoms include anxiety. Pertinent negatives include no chest pain, headaches, palpitations or shortness of breath. There are no compliance problems.  Hypertensive end-organ damage includes heart failure. followed by cardiology.  Diabetes  She presents for her follow-up diabetic visit. She has type 2 diabetes mellitus. Pertinent negatives for hypoglycemia include no dizziness, headaches, nervousness/anxiousness or tremors. Associated symptoms include fatigue. Pertinent negatives for diabetes include no chest pain, no polydipsia and no polyuria. There are no hypoglycemic complications. Symptoms are improving. Current diabetic treatments: insulin and bydureon. Her weight is stable. She is following a diabetic and generally healthy diet. She monitors blood glucose at home 1-2 x per day. Her breakfast blood glucose is taken between 7-8 am. Her breakfast blood glucose range is generally 110-130 mg/dl. An ACE inhibitor/angiotensin II receptor blocker is being taken. Eye exam is current.  Anxiety  Presents for follow-up visit. Patient reports no chest pain, decreased concentration, depressed mood, dizziness, nervous/anxious behavior, palpitations or shortness of breath. Symptoms occur most days. The quality of sleep is fair.   Compliance with medications is 76-100% (interupted sleep and dreams and no improvement in anxiety on Lexapro).  Hyperlipidemia  This is a chronic problem.  Pertinent negatives include no chest pain or shortness of breath. Current antihyperlipidemic treatment includes statins. The current treatment provides significant improvement of lipids.   CM - with defibrillator/pacemaker.  She is doing fairly well but has noted less energy lately.  She is not exercising as much and has gained a few pounds.  She denies chest pain, arrhythmia, shortness of breath, edema, dizziness or low BP.  Review of Systems  Constitutional: Positive for fatigue. Negative for chills and fever.  HENT: Negative for congestion, hearing loss, tinnitus, trouble swallowing and voice change.   Eyes: Negative for visual disturbance.  Respiratory: Negative for cough, chest tightness, shortness of breath and wheezing.   Cardiovascular: Negative for chest pain, palpitations and leg swelling.  Gastrointestinal: Negative for abdominal pain, constipation, diarrhea and vomiting.  Endocrine: Negative for polydipsia and polyuria.  Genitourinary: Negative for dysuria, frequency, genital sores, vaginal bleeding and vaginal discharge.  Musculoskeletal: Negative for arthralgias, gait problem and joint swelling.  Skin: Negative for color change and rash.  Neurological: Negative for dizziness, tremors, light-headedness and headaches.  Hematological: Negative for adenopathy. Does not bruise/bleed easily.  Psychiatric/Behavioral: Negative for decreased concentration, dysphoric mood and sleep disturbance. The patient is not nervous/anxious.     Patient Active Problem List   Diagnosis Date Noted  . Generalized anxiety disorder 05/15/2017  . Environmental and seasonal allergies 05/15/2017  . Elevated WBC count 07/03/2016  . Non-ischemic cardiomyopathy (HCC) 07/14/2015  . Interstitial lung disease (HCC) 06/19/2015  . Menopause 05/15/2015  . Anxiety, mild 03/30/2015  . Controlled type 2 diabetes mellitus without complication, with long-term current use of insulin (HCC) 03/30/2015  . Essential  (primary) hypertension 03/30/2015  . Hyperlipidemia associated with type 2 diabetes mellitus (HCC)  03/30/2015    Prior to Admission medications   Medication Sig Start Date End Date Taking? Authorizing Provider  atorvastatin (LIPITOR) 20 MG tablet TAKE 1 TABLET AT BEDTIME 03/05/17   Reubin Milan, MD  carvedilol (COREG) 3.125 MG tablet Take 1 tablet by mouth 2 (two) times daily. 10/24/15   [provider]  escitalopram (LEXAPRO) 10 MG tablet Take 1 tablet (10 mg total) by mouth daily. 06/23/17   Reubin Milan, MD  escitalopram (LEXAPRO) 10 MG tablet TAKE 1 TABLET BY MOUTH EVERY DAY 11/16/17   Reubin Milan, MD  estradiol (ESTRACE) 0.5 MG tablet TAKE 1 TABLET DAILY 12/25/16   Reubin Milan, MD  Exenatide ER (BYDUREON BCISE) 2 MG/0.85ML AUIJ Inject 2 mg into the skin once a week. 11/21/16   Reubin Milan, MD  furosemide (LASIX) 20 MG tablet Take 40 mg by mouth daily.  10/24/15   [provider]  glucose blood test strip Use as instructed 11/19/16   Reubin Milan, MD  insulin degludec (TRESIBA FLEXTOUCH) 100 UNIT/ML SOPN FlexTouch Pen Inject 20 Units into the skin daily at 10 pm.    [provider]  lisinopril (PRINIVIL,ZESTRIL) 40 MG tablet Take 1 tablet by mouth daily. 10/31/15   [provider]  medroxyPROGESTERone (PROVERA) 2.5 MG tablet TAKE 1 TABLET DAILY 12/25/16   Reubin Milan, MD  neomycin-polymyxin b-dexamethasone (MAXITROL) 3.5-10000-0.1 SUSP Place 2 drops into both eyes every 6 (six) hours. 05/15/17   Reubin Milan, MD  [facility] DELICA LANCETS 33G MISC  06/29/15   [provider]    Allergies  Allergen Reactions  . Januvia [Sitagliptin] Nausea Only    Past Surgical History:  Procedure Laterality Date  . CARDIAC CATHETERIZATION N/A 07/12/2015   Procedure: Right and Left Heart Cath;  Surgeon: Alwyn Pea, MD;  Location: [facility] INVASIVE CV LAB;  Service: Cardiovascular;  Laterality: N/A;  . CARDIAC DEFIBRILLATOR  PLACEMENT  01/2016  . CESAREAN SECTION    . GALLBLADDER SURGERY      Social History   Tobacco Use  . Smoking status: Former Games developer  . Smokeless tobacco: Never Used  Substance Use Topics  . Alcohol use: Yes    Alcohol/week: 7.2 oz    Types: 12 Cans of beer per week    Comment: occasional  . Drug use: No     Medication list has been reviewed and updated.  PHQ 2/9 Scores 12/10/2017 07/02/2016  PHQ - 2 Score 0 0    Physical Exam  Constitutional: She is oriented to person, place, and time. She appears well-developed and well-nourished. No distress.  HENT:  Head: Normocephalic and atraumatic.  Right Ear: Tympanic membrane and ear canal normal.  Left Ear: Tympanic membrane and ear canal normal.  Nose: Right sinus exhibits no maxillary sinus tenderness. Left sinus exhibits no maxillary sinus tenderness.  Mouth/Throat: Uvula is midline and oropharynx is clear and moist.  Eyes: Conjunctivae and EOM are normal. Right eye exhibits no discharge. Left eye exhibits no discharge. No scleral icterus.  Neck: Normal range of motion. Carotid bruit is not present. No erythema present. No thyromegaly present.  Cardiovascular: Normal rate, regular rhythm, normal heart sounds and normal pulses.  Pulmonary/Chest: Effort normal. No respiratory distress. She has no wheezes. Right breast exhibits no mass, no nipple discharge, no skin change and no tenderness. Left breast exhibits no mass, no nipple discharge, no skin change and no tenderness.  Abdominal: Soft. Bowel sounds are normal. There is no hepatosplenomegaly.  There is no tenderness. There is no CVA tenderness.  Musculoskeletal: Normal range of motion.  Lymphadenopathy:    She has no cervical adenopathy.    She has no axillary adenopathy.  Neurological: She is alert and oriented to person, place, and time. She has normal reflexes. No cranial nerve deficit or sensory deficit.  Skin: Skin is warm, dry and intact. No rash noted.  Psychiatric: She  has a normal mood and affect. Her speech is normal and behavior is normal. Thought content normal.  Nursing note and vitals reviewed.   BP 122/82   Pulse 76   Resp 16   Ht 5\' 2"  (1.575 m)   Wt 160 lb (72.6 kg)   SpO2 98%   BMI 29.26 kg/m   Assessment and Plan: 1. Annual physical exam Encouraged work on diet and weight loss  2. Breast cancer screening - MM DIGITAL SCREENING BILATERAL  3. Colon cancer screening Pt declines at this time  4. Non-ischemic cardiomyopathy (HCC) Follow up with Cardiology [id number]  5. ILD (interstitial lung disease) (HCC) stable  6. Controlled type 2 diabetes mellitus without complication, with long-term current use of insulin (HCC) Doing well, followed by Endocrinology q 6 mo. - TSH - POCT urinalysis dipstick - Microalbumin / creatinine urine ratio  7. Essential (primary) hypertension controlled - CBC with Differential/Platelet - Comprehensive metabolic panel  8. Anxiety, mild No improvement on Lexapro - d/c and start bupropion - buPROPion (WELLBUTRIN XL) 150 MG 24 hr tablet; Take 1 tablet (150 mg total) by mouth daily.  Dispense: 90 tablet; Refill: 1  9. Hyperlipidemia associated with type 2 diabetes mellitus (HCC) On statin therapy - Lipid panel  10. Need for pneumococcal vaccination - Pneumococcal polysaccharide vaccine 23-valent greater than or equal to 2yo subcutaneous/IM  11. Presence of cardiac defibrillator   Meds ordered this encounter  Medications  . buPROPion (WELLBUTRIN XL) 150 MG 24 hr tablet    Sig: Take 1 tablet (150 mg total) by mouth daily.    Dispense:  90 tablet    Refill:  1    Partially dictated using Animal nutritionist. Any errors are unintentional.  Bari Edward, MD [facility]  12/10/2017

## 2017-12-11 LAB — COMPREHENSIVE METABOLIC PANEL
ALK PHOS: 208 IU/L — AB (ref 39–117)
ALT: 29 IU/L (ref 0–32)
AST: 17 IU/L (ref 0–40)
Albumin/Globulin Ratio: 1.6 (ref 1.2–2.2)
Albumin: 4.8 g/dL (ref 3.5–5.5)
BUN/Creatinine Ratio: 17 (ref 9–23)
BUN: 15 mg/dL (ref 6–24)
Bilirubin Total: 0.4 mg/dL (ref 0.0–1.2)
CO2: 25 mmol/L (ref 20–29)
CREATININE: 0.88 mg/dL (ref 0.57–1.00)
Calcium: 9.7 mg/dL (ref 8.7–10.2)
Chloride: 102 mmol/L (ref 96–106)
GFR calc Af Amer: 85 mL/min/{1.73_m2} (ref >59)
GFR calc non Af Amer: 74 mL/min/{1.73_m2} (ref >59)
GLOBULIN, TOTAL: 3 g/dL (ref 1.5–4.5)
Glucose: 91 mg/dL (ref 65–99)
POTASSIUM: 4.8 mmol/L (ref 3.5–5.2)
SODIUM: 143 mmol/L (ref 134–144)
Total Protein: 7.8 g/dL (ref 6.0–8.5)

## 2017-12-11 LAB — TSH: TSH: 1.03 u[IU]/mL (ref 0.450–4.500)

## 2017-12-11 LAB — CBC WITH DIFFERENTIAL/PLATELET
BASOS ABS: 0 10*3/uL (ref 0.0–0.2)
Basos: 0 %
EOS (ABSOLUTE): 0.4 10*3/uL (ref 0.0–0.4)
EOS: 4 %
HEMATOCRIT: 39 % (ref 34.0–46.6)
Hemoglobin: 12.9 g/dL (ref 11.1–15.9)
Immature Grans (Abs): 0 10*3/uL (ref 0.0–0.1)
Immature Granulocytes: 0 %
LYMPHS ABS: 3.1 10*3/uL (ref 0.7–3.1)
Lymphs: 33 %
MCH: 28.5 pg (ref 26.6–33.0)
MCHC: 33.1 g/dL (ref 31.5–35.7)
MCV: 86 fL (ref 79–97)
MONOS ABS: 0.6 10*3/uL (ref 0.1–0.9)
Monocytes: 6 %
Neutrophils Absolute: 5.4 10*3/uL (ref 1.4–7.0)
Neutrophils: 57 %
Platelets: 305 10*3/uL (ref 150–379)
RBC: 4.53 x10E6/uL (ref 3.77–5.28)
RDW: 13.9 % (ref 12.3–15.4)
WBC: 9.5 10*3/uL (ref 3.4–10.8)

## 2017-12-11 LAB — LIPID PANEL
Chol/HDL Ratio: 4.4 ratio (ref 0.0–4.4)
Cholesterol, Total: 153 mg/dL (ref 100–199)
HDL: 35 mg/dL — ABNORMAL LOW (ref >39)
LDL CALC: 91 mg/dL (ref 0–99)
TRIGLYCERIDES: 133 mg/dL (ref 0–149)
VLDL Cholesterol Cal: 27 mg/dL (ref 5–40)

## 2017-12-15 LAB — MICROALBUMIN / CREATININE URINE RATIO
Creatinine, Urine: 78.2 mg/dL
MICROALBUM., U, RANDOM: 10.8 ug/mL
Microalb/Creat Ratio: 13.8 mg/g creat (ref 0.0–30.0)

## 2017-12-22 ENCOUNTER — Encounter: Payer: Self-pay | Admitting: Internal Medicine

## 2017-12-23 NOTE — Telephone Encounter (Signed)
Patient mychart message. Please Advise.

## 2017-12-30 DIAGNOSIS — E1165 Type 2 diabetes mellitus with hyperglycemia: Secondary | ICD-10-CM | POA: Diagnosis not present

## 2017-12-30 DIAGNOSIS — Z794 Long term (current) use of insulin: Secondary | ICD-10-CM | POA: Diagnosis not present

## 2018-01-19 DIAGNOSIS — I481 Persistent atrial fibrillation: Secondary | ICD-10-CM | POA: Diagnosis not present

## 2018-02-18 DIAGNOSIS — I5022 Chronic systolic (congestive) heart failure: Secondary | ICD-10-CM | POA: Diagnosis not present

## 2018-02-18 DIAGNOSIS — R001 Bradycardia, unspecified: Secondary | ICD-10-CM | POA: Diagnosis not present

## 2018-02-18 DIAGNOSIS — I428 Other cardiomyopathies: Secondary | ICD-10-CM | POA: Diagnosis not present

## 2018-02-18 DIAGNOSIS — I481 Persistent atrial fibrillation: Secondary | ICD-10-CM | POA: Diagnosis not present

## 2018-03-26 DIAGNOSIS — Z794 Long term (current) use of insulin: Secondary | ICD-10-CM | POA: Diagnosis not present

## 2018-03-26 DIAGNOSIS — E1165 Type 2 diabetes mellitus with hyperglycemia: Secondary | ICD-10-CM | POA: Diagnosis not present

## 2018-04-01 ENCOUNTER — Encounter: Payer: Self-pay | Admitting: Internal Medicine

## 2018-04-02 DIAGNOSIS — Z794 Long term (current) use of insulin: Secondary | ICD-10-CM | POA: Diagnosis not present

## 2018-04-02 DIAGNOSIS — E1165 Type 2 diabetes mellitus with hyperglycemia: Secondary | ICD-10-CM | POA: Diagnosis not present

## 2018-04-20 DIAGNOSIS — I480 Paroxysmal atrial fibrillation: Secondary | ICD-10-CM | POA: Diagnosis not present

## 2018-06-25 ENCOUNTER — Other Ambulatory Visit: Payer: Self-pay | Admitting: Internal Medicine

## 2018-07-20 DIAGNOSIS — I480 Paroxysmal atrial fibrillation: Secondary | ICD-10-CM | POA: Diagnosis not present

## 2018-08-11 DIAGNOSIS — E1165 Type 2 diabetes mellitus with hyperglycemia: Secondary | ICD-10-CM | POA: Diagnosis not present

## 2018-08-11 DIAGNOSIS — Z794 Long term (current) use of insulin: Secondary | ICD-10-CM | POA: Diagnosis not present

## 2018-08-11 LAB — HEMOGLOBIN A1C: Hemoglobin A1C: 6.4

## 2018-08-11 LAB — MICROALBUMIN, URINE: Microalb, Ur: NORMAL

## 2018-10-14 DIAGNOSIS — E119 Type 2 diabetes mellitus without complications: Secondary | ICD-10-CM | POA: Diagnosis not present

## 2018-10-19 DIAGNOSIS — I5022 Chronic systolic (congestive) heart failure: Secondary | ICD-10-CM | POA: Diagnosis not present

## 2018-10-23 LAB — HM DIABETES EYE EXAM

## 2018-11-02 ENCOUNTER — Encounter: Payer: Self-pay | Admitting: Internal Medicine

## 2018-11-02 ENCOUNTER — Other Ambulatory Visit: Payer: Self-pay

## 2018-11-02 DIAGNOSIS — I428 Other cardiomyopathies: Secondary | ICD-10-CM

## 2018-11-02 MED ORDER — ESTRADIOL 0.5 MG PO TABS: 0.5000 mg | ORAL_TABLET | Freq: Every day | ORAL | 0 refills | Status: DC

## 2018-11-02 MED ORDER — MEDROXYPROGESTERONE ACETATE 2.5 MG PO TABS: 2.5000 mg | ORAL_TABLET | Freq: Every day | ORAL | 0 refills | Status: DC

## 2018-12-07 ENCOUNTER — Encounter: Payer: Self-pay | Admitting: Internal Medicine

## 2018-12-10 DIAGNOSIS — E1165 Type 2 diabetes mellitus with hyperglycemia: Secondary | ICD-10-CM | POA: Diagnosis not present

## 2018-12-10 DIAGNOSIS — Z794 Long term (current) use of insulin: Secondary | ICD-10-CM | POA: Diagnosis not present

## 2018-12-14 ENCOUNTER — Encounter: Payer: BLUE CROSS/BLUE SHIELD | Admitting: Internal Medicine

## 2018-12-27 ENCOUNTER — Encounter: Payer: Self-pay | Admitting: Internal Medicine

## 2018-12-28 ENCOUNTER — Ambulatory Visit (INDEPENDENT_AMBULATORY_CARE_PROVIDER_SITE_OTHER): Payer: BLUE CROSS/BLUE SHIELD | Admitting: Internal Medicine

## 2018-12-28 ENCOUNTER — Other Ambulatory Visit: Payer: Self-pay

## 2018-12-28 ENCOUNTER — Other Ambulatory Visit: Payer: Self-pay | Admitting: Internal Medicine

## 2018-12-28 ENCOUNTER — Encounter: Payer: Self-pay | Admitting: Internal Medicine

## 2018-12-28 VITALS — BP 140/70 | Ht 62.0 in | Wt 160.0 lb

## 2018-12-28 DIAGNOSIS — J069 Acute upper respiratory infection, unspecified: Secondary | ICD-10-CM | POA: Diagnosis not present

## 2018-12-28 DIAGNOSIS — J9801 Acute bronchospasm: Secondary | ICD-10-CM

## 2018-12-28 MED ORDER — PREDNISONE 10 MG PO TABS: 10.0000 mg | ORAL_TABLET | ORAL | 0 refills | Status: DC

## 2018-12-28 MED ORDER — ALBUTEROL SULFATE HFA 108 (90 BASE) MCG/ACT IN AERS: 2.0000 | INHALATION_SPRAY | Freq: Four times a day (QID) | RESPIRATORY_TRACT | 0 refills | Status: DC | PRN

## 2018-12-28 NOTE — Progress Notes (Signed)
Date:  12/28/2018   Name:  Lori Brewer   DOB:  06-05-1961   MRN:  [id number]  This encounter was conducted via video encounter due to the need for social distancing in light of the Covid-19 pandemic.  The patient was correctly identified.  I advised that I am conducting the visit from a secure room in my office at [facility] clinic.   The limitations of this form of encounter were discussed with the patient and he/she agreed to proceed.  Some vital signs will be absent.  Chief Complaint: Cough (Cough with SOB. Has not been around anyone with positive COVID or traveled. Started around 2/10th. Did MD live call and was given amoxicillin 875 mg. It did seem to help some, but starting get symptoms back again. Only have one day left of abx. Cough is able to handle but SOB causes her to have tightness in chest. Coughing up some yellow.  )  Cough  This is a new problem. The current episode started 1 to 4 weeks ago (2 weeks ago). The problem has been unchanged. The problem occurs every few minutes. The cough is non-productive. Associated symptoms include nasal congestion, shortness of breath (with activity or cough) and wheezing. Pertinent negatives include no chest pain, chills, fever, headaches, hemoptysis, postnasal drip or sweats. The symptoms are aggravated by exercise. Risk factors for lung disease include smoking/tobacco exposure. Treatments tried: augmentin. The treatment provided mild relief. Her past medical history is significant for asthma (CT scan 5 years ago suggesting small airway disease) and environmental allergies.   CT chest 06/2015: IMPRESSION: 1. No findings to suggest interstitial lung disease. 2. No acute findings in the thorax. 3. Mild air trapping, indicative of mild small airways disease. 4. Mild cardiomegaly.  She began to have cough and mild sputum production 2 weeks ago.  She did a virtual visit through [facility] and was prescribed Augmentin which she will finish tomorrow.   She was feeling better but now feels more chest tightness and mild SOB.  Sx are similar to previous sx when she was seen by pulmonology. She has used Advair and albuterol in the past.  She is not having trouble with sleep.  Review of Systems  Constitutional: Negative for chills and fever.  HENT: Positive for congestion. Negative for postnasal drip, sinus pressure and trouble swallowing.   Eyes: Negative for visual disturbance.  Respiratory: Positive for cough, shortness of breath (with activity or cough) and wheezing. Negative for hemoptysis.   Cardiovascular: Negative for chest pain and palpitations.  Gastrointestinal: Negative for diarrhea, nausea and vomiting.  Allergic/Immunologic: Positive for environmental allergies.  Neurological: Negative for dizziness and headaches.  Psychiatric/Behavioral: Negative for sleep disturbance.    Patient Active Problem List   Diagnosis Date Noted  . Presence of automatic cardioverter/defibrillator (AICD) 12/10/2017  . Generalized anxiety disorder 05/15/2017  . Environmental and seasonal allergies 05/15/2017  . Elevated WBC count 07/03/2016  . Non-ischemic cardiomyopathy (HCC) 07/14/2015  . Interstitial lung disease (HCC) 06/19/2015  . Menopause 05/15/2015  . Anxiety, mild 03/30/2015  . Controlled type 2 diabetes mellitus without complication, with long-term current use of insulin (HCC) 03/30/2015  . Essential (primary) hypertension 03/30/2015  . Hyperlipidemia associated with type 2 diabetes mellitus (HCC) 03/30/2015    Allergies  Allergen Reactions  . Januvia [Sitagliptin] Nausea Only    Past Surgical History:  Procedure Laterality Date  . CARDIAC CATHETERIZATION N/A 07/12/2015   Procedure: Right and Left Heart Cath;  Surgeon: Alwyn Pea,  MD;  Location: [facility] INVASIVE CV LAB;  Service: Cardiovascular;  Laterality: N/A;  . CARDIAC DEFIBRILLATOR PLACEMENT  01/2016  . CESAREAN SECTION    . GALLBLADDER SURGERY      Social History    Tobacco Use  . Smoking status: Former Games developer  . Smokeless tobacco: Never Used  Substance Use Topics  . Alcohol use: Yes    Alcohol/week: 12.0 standard drinks    Types: 12 Cans of beer per week    Comment: occasional  . Drug use: No     Medication list has been reviewed and updated.  Current Meds  Medication Sig  . atorvastatin (LIPITOR) 20 MG tablet TAKE 1 TABLET AT BEDTIME  . B-D ULTRAFINE III SHORT PEN 31G X 8 MM MISC   . buPROPion (WELLBUTRIN XL) 150 MG 24 hr tablet Take 1 tablet (150 mg total) by mouth daily.  . carvedilol (COREG) 3.125 MG tablet Take 1 tablet by mouth 2 (two) times daily.  Marland Kitchen estradiol (ESTRACE) 0.5 MG tablet Take 1 tablet (0.5 mg total) by mouth daily.  . Exenatide ER (BYDUREON BCISE) 2 MG/0.85ML AUIJ Inject 2 mg into the skin once a week.  . furosemide (LASIX) 20 MG tablet Take 40 mg by mouth daily.   Marland Kitchen glucose blood test strip Use as instructed  . insulin degludec (TRESIBA FLEXTOUCH) 100 UNIT/ML SOPN FlexTouch Pen Inject 20 Units into the skin daily at 10 pm.  . lisinopril (PRINIVIL,ZESTRIL) 40 MG tablet Take 1 tablet by mouth daily.  . medroxyPROGESTERone (PROVERA) 2.5 MG tablet Take 1 tablet (2.5 mg total) by mouth daily.  Letta Pate DELICA LANCETS 33G MISC   . OZEMPIC, 0.25 OR 0.5 MG/DOSE, 2 MG/1.5ML SOPN     PHQ 2/9 Scores 12/28/2018 12/10/2017 07/02/2016  PHQ - 2 Score 1 0 0    BP Readings from Last 3 Encounters:  12/28/18 140/70  12/10/17 122/82  05/15/17 (!) 148/74    Physical Exam Constitutional:      General: She is not in acute distress.    Appearance: Normal appearance. She is not ill-appearing or diaphoretic.  Pulmonary:     Comments: No cough or dyspnea noted during the call Neurological:     Mental Status: She is alert.  Psychiatric:        Attention and Perception: Attention normal.        Mood and Affect: Mood normal.        Speech: Speech normal.        Cognition and Memory: Cognition normal.     Wt Readings from Last 3  Encounters:  12/28/18 160 lb (72.6 kg)  12/10/17 160 lb (72.6 kg)  05/15/17 152 lb 12.8 oz (69.3 kg)    BP 140/70 Comment: Patient cannot take at home.  Ht 5\' 2"  (1.575 m)   Wt 160 lb (72.6 kg)   BMI 29.26 kg/m   Assessment and Plan: 1. Bronchospasm Following recent URI treatment May resume Claritin - predniSONE (DELTASONE) 10 MG tablet; Take 1 tablet (10 mg total) by mouth as directed for 6 days. Take 6,5,4,3,2,1 then stop  Dispense: 21 tablet; Refill: 0 - albuterol (VENTOLIN HFA) 108 (90 Base) MCG/ACT inhaler; Inhale 2 puffs into the lungs every 6 (six) hours as needed for wheezing or shortness of breath.  Dispense: 1 Inhaler; Refill: 0  2. Upper respiratory tract infection, unspecified type Finish antibiotics  I spent 15 minutes on this encounter, including time to review previous CT scans and previous pulmonary evaluation. Partially dictated using  Animal nutritionistDragon software. Any errors are unintentional.  Bari EdwardLaura Kelyn Ponciano, MD [facility]  12/28/2018

## 2019-01-03 ENCOUNTER — Encounter: Payer: Self-pay | Admitting: Internal Medicine

## 2019-01-03 ENCOUNTER — Other Ambulatory Visit: Payer: Self-pay | Admitting: Internal Medicine

## 2019-01-03 DIAGNOSIS — J9801 Acute bronchospasm: Secondary | ICD-10-CM

## 2019-01-03 MED ORDER — PREDNISONE 10 MG PO TABS: ORAL_TABLET | ORAL | 0 refills | Status: DC

## 2019-01-03 NOTE — Telephone Encounter (Signed)
Please advise patient message.

## 2019-01-03 NOTE — Telephone Encounter (Signed)
Patient response to your msg?

## 2019-01-03 NOTE — Telephone Encounter (Signed)
Please advise 

## 2019-01-18 DIAGNOSIS — R001 Bradycardia, unspecified: Secondary | ICD-10-CM | POA: Diagnosis not present

## 2019-01-27 ENCOUNTER — Other Ambulatory Visit: Payer: Self-pay | Admitting: Internal Medicine

## 2019-01-27 DIAGNOSIS — I428 Other cardiomyopathies: Secondary | ICD-10-CM

## 2019-01-28 ENCOUNTER — Encounter: Payer: Self-pay | Admitting: Internal Medicine

## 2019-01-28 ENCOUNTER — Other Ambulatory Visit: Payer: Self-pay

## 2019-01-28 ENCOUNTER — Other Ambulatory Visit (HOSPITAL_COMMUNITY)
Admission: RE | Admit: 2019-01-28 | Discharge: 2019-01-28 | Disposition: A | Discharge status: Home / Self Care | Payer: BLUE CROSS/BLUE SHIELD | Source: Ambulatory Visit | Attending: Internal Medicine | Admitting: Internal Medicine

## 2019-01-28 ENCOUNTER — Ambulatory Visit (INDEPENDENT_AMBULATORY_CARE_PROVIDER_SITE_OTHER): Payer: BLUE CROSS/BLUE SHIELD | Admitting: Internal Medicine

## 2019-01-28 VITALS — BP 119/69 | HR 68 | Resp 16 | Ht 62.0 in | Wt 156.0 lb

## 2019-01-28 DIAGNOSIS — E1169 Type 2 diabetes mellitus with other specified complication: Secondary | ICD-10-CM | POA: Diagnosis not present

## 2019-01-28 DIAGNOSIS — Z0000 Encounter for general adult medical examination without abnormal findings: Secondary | ICD-10-CM | POA: Diagnosis not present

## 2019-01-28 DIAGNOSIS — Z124 Encounter for screening for malignant neoplasm of cervix: Secondary | ICD-10-CM | POA: Insufficient documentation

## 2019-01-28 DIAGNOSIS — E785 Hyperlipidemia, unspecified: Secondary | ICD-10-CM

## 2019-01-28 DIAGNOSIS — I10 Essential (primary) hypertension: Secondary | ICD-10-CM | POA: Diagnosis not present

## 2019-01-28 DIAGNOSIS — E119 Type 2 diabetes mellitus without complications: Secondary | ICD-10-CM

## 2019-01-28 DIAGNOSIS — Z794 Long term (current) use of insulin: Secondary | ICD-10-CM | POA: Diagnosis not present

## 2019-01-28 DIAGNOSIS — J849 Interstitial pulmonary disease, unspecified: Secondary | ICD-10-CM

## 2019-01-28 DIAGNOSIS — Z1231 Encounter for screening mammogram for malignant neoplasm of breast: Secondary | ICD-10-CM | POA: Diagnosis not present

## 2019-01-28 DIAGNOSIS — Z1211 Encounter for screening for malignant neoplasm of colon: Secondary | ICD-10-CM

## 2019-01-28 LAB — POCT URINALYSIS DIPSTICK
Bilirubin, UA: NEGATIVE
Blood, UA: NEGATIVE
Glucose, UA: NEGATIVE
Ketones, UA: NEGATIVE
Leukocytes, UA: NEGATIVE
Nitrite, UA: NEGATIVE
Protein, UA: NEGATIVE
Spec Grav, UA: 1.01 (ref 1.010–1.025)
Urobilinogen, UA: 0.2 E.U./dL
pH, UA: 7.5 (ref 5.0–8.0)

## 2019-01-28 NOTE — Progress Notes (Signed)
Date:  01/28/2019   Name:  Lori Brewer   DOB:  05/25/1961   MRN:  [id number]   Chief Complaint: Annual Exam (PAP) Lori Brewer is a 58 y.o. female who presents today for her Complete Annual Exam. She feels fairly well. She reports exercising none. She reports she is sleeping fairly well. She denies breast issues.  She is on HRT for control of menopausal sx.  Mammogram 2016 No colonoscopy Due for eye exam, foot exam, Tdap, pap smear.  Hypertension  The problem is controlled. Pertinent negatives include no chest pain, headaches, palpitations or shortness of breath.  Diabetes  She presents for her follow-up diabetic visit. She has type 2 diabetes mellitus. Pertinent negatives for hypoglycemia include no dizziness, headaches, nervousness/anxiousness or tremors. Pertinent negatives for diabetes include no chest pain, no fatigue, no polydipsia and no polyuria. Current diabetic treatments: [place] and Ozempic. She is compliant with treatment all of the time. She monitors blood glucose at home 1-2 x per day. Her breakfast blood glucose is taken between 6-7 am. Her breakfast blood glucose range is generally 90-110 mg/dl. An ACE inhibitor/angiotensin II receptor blocker is being taken.  Hyperlipidemia  Pertinent negatives include no chest pain or shortness of breath. Current antihyperlipidemic treatment includes statins. The current treatment provides significant improvement of lipids.  CAD - s/p AICD. Seen by cardiology yearly - due in June.  Energy is good, but not exercising regularly. Overall she feels very well.  ILD -  Recently treated for bronchitis with antibiotics and prednisone taper.  No previous dx of asthma, tobacco use. She is much improved but still using albuterol inhaler several times per week.  Lab Results  Component Value Date   HGBA1C 6.4 08/11/2018   Lab Results  Component Value Date   CHOL 153 12/10/2017   HDL 35 (L) 12/10/2017   LDLCALC 91 12/10/2017   TRIG 133  12/10/2017   CHOLHDL 4.4 12/10/2017   Lab Results  Component Value Date   CREATININE 0.88 12/10/2017   BUN 15 12/10/2017   NA 143 12/10/2017   K 4.8 12/10/2017   CL 102 12/10/2017   CO2 25 12/10/2017     Review of Systems  Constitutional: Negative for chills, fatigue and fever.  HENT: Negative for congestion, hearing loss, tinnitus, trouble swallowing and voice change.   Eyes: Negative for visual disturbance.  Respiratory: Negative for cough, chest tightness, shortness of breath and wheezing.   Cardiovascular: Negative for chest pain, palpitations and leg swelling.  Gastrointestinal: Negative for abdominal pain, constipation, diarrhea and vomiting.  Endocrine: Negative for polydipsia and polyuria.  Genitourinary: Negative for dysuria, frequency, genital sores, vaginal bleeding and vaginal discharge.  Musculoskeletal: Negative for arthralgias, gait problem and joint swelling.  Skin: Negative for color change and rash.  Neurological: Negative for dizziness, tremors, light-headedness and headaches.  Hematological: Negative for adenopathy. Does not bruise/bleed easily.  Psychiatric/Behavioral: Negative for dysphoric mood and sleep disturbance. The patient is not nervous/anxious.     Patient Active Problem List   Diagnosis Date Noted  . Presence of automatic cardioverter/defibrillator (AICD) 12/10/2017  . Generalized anxiety disorder 05/15/2017  . Environmental and seasonal allergies 05/15/2017  . Elevated WBC count 07/03/2016  . Non-ischemic cardiomyopathy (HCC) 07/14/2015  . Interstitial lung disease (HCC) 06/19/2015  . Menopause 05/15/2015  . Anxiety, mild 03/30/2015  . Controlled type 2 diabetes mellitus without complication, with long-term current use of insulin (HCC) 03/30/2015  . Essential (primary) hypertension 03/30/2015  . Hyperlipidemia associated with  type 2 diabetes mellitus (HCC) 03/30/2015    Allergies  Allergen Reactions  . Januvia [Sitagliptin] Nausea Only     Past Surgical History:  Procedure Laterality Date  . CARDIAC CATHETERIZATION N/A 07/12/2015   Procedure: Right and Left Heart Cath;  Surgeon: Alwyn Pea, MD;  Location: [facility] INVASIVE CV LAB;  Service: Cardiovascular;  Laterality: N/A;  . CARDIAC DEFIBRILLATOR PLACEMENT  01/2016  . CESAREAN SECTION    . GALLBLADDER SURGERY      Social History   Tobacco Use  . Smoking status: Former Games developer  . Smokeless tobacco: Never Used  Substance Use Topics  . Alcohol use: Yes    Alcohol/week: 12.0 standard drinks    Types: 12 Cans of beer per week    Comment: occasional  . Drug use: No     Medication list has been reviewed and updated.  Current Meds  Medication Sig  . albuterol (VENTOLIN HFA) 108 (90 Base) MCG/ACT inhaler Inhale 2 puffs into the lungs every 6 (six) hours as needed for wheezing or shortness of breath.  Marland Kitchen atorvastatin (LIPITOR) 20 MG tablet TAKE 1 TABLET AT BEDTIME  . B-D ULTRAFINE III SHORT PEN 31G X 8 MM MISC   . carvedilol (COREG) 3.125 MG tablet Take 1 tablet by mouth 2 (two) times daily.  Marland Kitchen estradiol (ESTRACE) 0.5 MG tablet TAKE 1 TABLET DAILY.  . furosemide (LASIX) 20 MG tablet Take 40 mg by mouth daily.   Marland Kitchen glucose blood test strip Use as instructed  . insulin degludec (TRESIBA FLEXTOUCH) 100 UNIT/ML SOPN FlexTouch Pen Inject 20 Units into the skin daily at 10 pm.  . lisinopril (PRINIVIL,ZESTRIL) 40 MG tablet Take 1 tablet by mouth daily.  . medroxyPROGESTERone (PROVERA) 2.5 MG tablet TAKE 1 TABLET DAILY.  Marland Kitchen ONETOUCH DELICA LANCETS 33G MISC   . OZEMPIC, 0.25 OR 0.5 MG/DOSE, 2 MG/1.5ML SOPN     PHQ 2/9 Scores 01/28/2019 12/28/2018 12/10/2017 07/02/2016  PHQ - 2 Score 0 1 0 0  PHQ- 9 Score 0 - - -    BP Readings from Last 3 Encounters:  01/28/19 119/69  12/28/18 140/70  12/10/17 122/82    Physical Exam Vitals signs and nursing note reviewed.  Constitutional:      General: She is not in acute distress.    Appearance: She is well-developed.  HENT:      Head: Normocephalic and atraumatic.     Right Ear: Tympanic membrane and ear canal normal.     Left Ear: Tympanic membrane and ear canal normal.     Nose:     Right Sinus: No maxillary sinus tenderness.     Left Sinus: No maxillary sinus tenderness.     Mouth/Throat:     Mouth: Mucous membranes are moist.     Pharynx: Uvula midline.  Eyes:     General: No scleral icterus.       Right eye: No discharge.        Left eye: No discharge.     Conjunctiva/sclera: Conjunctivae normal.  Neck:     Musculoskeletal: Normal range of motion. No erythema.     Thyroid: No thyromegaly.     Vascular: No carotid bruit.  Cardiovascular:     Rate and Rhythm: Normal rate and regular rhythm.     Pulses: Normal pulses.     Heart sounds: Normal heart sounds.  Pulmonary:     Effort: Pulmonary effort is normal. No respiratory distress.     Breath sounds: No wheezing.  Chest:  Breasts:        Right: No mass, nipple discharge, skin change or tenderness.        Left: No mass, nipple discharge, skin change or tenderness.  Abdominal:     General: Bowel sounds are normal.     Palpations: Abdomen is soft.     Tenderness: There is no abdominal tenderness.     Hernia: There is no hernia in the right inguinal area or left inguinal area.  Genitourinary:    General: Normal vulva.     Labia:        Right: No tenderness or lesion.        Left: No tenderness or lesion.      Vagina: Normal.     Cervix: Normal.     Uterus: Normal.      Adnexa: Right adnexa normal and left adnexa normal.     Rectum: Normal.  Musculoskeletal: Normal range of motion.     Right lower leg: No edema.     Left lower leg: No edema.  Lymphadenopathy:     Cervical: No cervical adenopathy.  Skin:    General: Skin is warm and dry.     Findings: No rash.  Neurological:     Mental Status: She is alert and oriented to person, place, and time.     Cranial Nerves: No cranial nerve deficit.     Sensory: No sensory deficit.     Deep  Tendon Reflexes: Reflexes are normal and symmetric.  Psychiatric:        Speech: Speech normal.        Behavior: Behavior normal.        Thought Content: Thought content normal.     Wt Readings from Last 3 Encounters:  01/28/19 156 lb (70.8 kg)  12/28/18 160 lb (72.6 kg)  12/10/17 160 lb (72.6 kg)    BP 119/69   Pulse 68   Resp 16   Ht 5\' 2"  (1.575 m)   Wt 156 lb (70.8 kg)   SpO2 98%   BMI 28.53 kg/m   Assessment and Plan: 1. Annual physical exam Normal exam Continue healthy diet, add exercise - POCT urinalysis dipstick  2. Encounter for screening mammogram for breast cancer - MM 3D SCREEN BREAST BILATERAL; Future  3. Essential (primary) hypertension controlled - CBC with Differential/Platelet - Comprehensive metabolic panel - TSH  4. Controlled type 2 diabetes mellitus without complication, with long-term current use of insulin (HCC) Doing very well, followed by endocrinology - Hemoglobin A1c  5. Hyperlipidemia associated with type 2 diabetes mellitus (HCC) On statin - Lipid panel  6. Interstitial lung disease (HCC) Recovered from bronchitis Continue albuterol PRN - follow up if worsening  7. Encounter for Papanicolaou smear for cervical cancer screening - Cytology - PAP  8. Colon cancer screening - Fecal occult blood, imunochemical   Partially dictated using Animal nutritionist. Any errors are unintentional.  Bari Edward, MD [facility]  01/28/2019

## 2019-01-28 NOTE — Patient Instructions (Signed)
Please ask you Eye Doctor to forward his notes to me, as well as Dr. Tedd Sias.  Thanks!  Schedule your mammogram.  Breast Self-Awareness Breast self-awareness means being familiar with how your breasts look and feel. It involves checking your breasts regularly and reporting any changes to your health care provider. Practicing breast self-awareness is important. A change in your breasts can be a sign of a serious medical problem. Being familiar with how your breasts look and feel allows you to find any problems early, when treatment is more likely to be successful. All women should practice breast self-awareness, including women who have had breast implants. How to do a breast self-exam One way to learn what is normal for your breasts and whether your breasts are changing is to do a breast self-exam. To do a breast self-exam: Look for Changes  1. Remove all the clothing above your waist. 2. Stand in front of a mirror in a room with good lighting. 3. Put your hands on your hips. 4. Push your hands firmly downward. 5. Compare your breasts in the mirror. Look for differences between them (asymmetry), such as: ? Differences in shape. ? Differences in size. ? Puckers, dips, and bumps in one breast and not the other. 6. Look at each breast for changes in your skin, such as: ? Redness. ? Scaly areas. 7. Look for changes in your nipples, such as: ? Discharge. ? Bleeding. ? Dimpling. ? Redness. ? A change in position. Feel for Changes Carefully feel your breasts for lumps and changes. It is best to do this while lying on your back on the floor and again while sitting or standing in the shower or tub with soapy water on your skin. Feel each breast in the following way:  Place the arm on the side of the breast you are examining above your head.  Feel your breast with the other hand.  Start in the nipple area and make  inch (2 cm) overlapping circles to feel your breast. Use the pads of your  three middle fingers to do this. Apply light pressure, then medium pressure, then firm pressure. The light pressure will allow you to feel the tissue closest to the skin. The medium pressure will allow you to feel the tissue that is a little deeper. The firm pressure will allow you to feel the tissue close to the ribs.  Continue the overlapping circles, moving downward over the breast until you feel your ribs below your breast.  Move one finger-width toward the center of the body. Continue to use the  inch (2 cm) overlapping circles to feel your breast as you move slowly up toward your collarbone.  Continue the up and down exam using all three pressures until you reach your armpit.  Write Down What You Find  Write down what is normal for each breast and any changes that you find. Keep a written record with breast changes or normal findings for each breast. By writing this information down, you do not need to depend only on memory for size, tenderness, or location. Write down where you are in your menstrual cycle, if you are still menstruating. If you are having trouble noticing differences in your breasts, do not get discouraged. With time you will become more familiar with the variations in your breasts and more comfortable with the exam. How often should I examine my breasts? Examine your breasts every month. If you are breastfeeding, the best time to examine your breasts is after a  feeding or after using a breast pump. If you menstruate, the best time to examine your breasts is 5-7 days after your period is over. During your period, your breasts are lumpier, and it may be more difficult to notice changes. When should I see my health care provider? See your health care provider if you notice:  A change in shape or size of your breasts or nipples.  A change in the skin of your breast or nipples, such as a reddened or scaly area.  Unusual discharge from your nipples.  A lump or thick area  that was not there before.  Pain in your breasts.  Anything that concerns you. This information is not intended to replace advice given to you by your health care provider. Make sure you discuss any questions you have with your health care provider. Document Released: 08/25/2005 Document Revised: 01/31/2016 Document Reviewed: 07/15/2015 Elsevier Interactive Patient Education  2019 [employer].

## 2019-01-29 LAB — LIPID PANEL
Chol/HDL Ratio: 4.8 ratio — ABNORMAL HIGH (ref 0.0–4.4)
Cholesterol, Total: 158 mg/dL (ref 100–199)
HDL: 33 mg/dL — ABNORMAL LOW (ref >39)
LDL Calculated: 87 mg/dL (ref 0–99)
Triglycerides: 190 mg/dL — ABNORMAL HIGH (ref 0–149)
VLDL Cholesterol Cal: 38 mg/dL (ref 5–40)

## 2019-01-29 LAB — CBC WITH DIFFERENTIAL/PLATELET
Basophils Absolute: 0.1 10*3/uL (ref 0.0–0.2)
Basos: 1 %
EOS (ABSOLUTE): 0.4 10*3/uL (ref 0.0–0.4)
Eos: 5 %
Hematocrit: 39.6 % (ref 34.0–46.6)
Hemoglobin: 13.4 g/dL (ref 11.1–15.9)
Immature Grans (Abs): 0 10*3/uL (ref 0.0–0.1)
Immature Granulocytes: 0 %
Lymphocytes Absolute: 1.6 10*3/uL (ref 0.7–3.1)
Lymphs: 22 %
MCH: 30.1 pg (ref 26.6–33.0)
MCHC: 33.8 g/dL (ref 31.5–35.7)
MCV: 89 fL (ref 79–97)
Monocytes Absolute: 0.6 10*3/uL (ref 0.1–0.9)
Monocytes: 8 %
Neutrophils Absolute: 4.8 10*3/uL (ref 1.4–7.0)
Neutrophils: 64 %
Platelets: 304 10*3/uL (ref 150–450)
RBC: 4.45 x10E6/uL (ref 3.77–5.28)
RDW: 13.4 % (ref 11.7–15.4)
WBC: 7.5 10*3/uL (ref 3.4–10.8)

## 2019-01-29 LAB — COMPREHENSIVE METABOLIC PANEL
ALT: 18 IU/L (ref 0–32)
AST: 17 IU/L (ref 0–40)
Albumin/Globulin Ratio: 1.9 (ref 1.2–2.2)
Albumin: 4.7 g/dL (ref 3.8–4.9)
Alkaline Phosphatase: 139 IU/L — ABNORMAL HIGH (ref 39–117)
BUN/Creatinine Ratio: 13 (ref 9–23)
BUN: 11 mg/dL (ref 6–24)
Bilirubin Total: 0.7 mg/dL (ref 0.0–1.2)
CO2: 26 mmol/L (ref 20–29)
Calcium: 9.6 mg/dL (ref 8.7–10.2)
Chloride: 98 mmol/L (ref 96–106)
Creatinine, Ser: 0.86 mg/dL (ref 0.57–1.00)
GFR calc Af Amer: 87 mL/min/{1.73_m2} (ref >59)
GFR calc non Af Amer: 75 mL/min/{1.73_m2} (ref >59)
Globulin, Total: 2.5 g/dL (ref 1.5–4.5)
Glucose: 109 mg/dL — ABNORMAL HIGH (ref 65–99)
Potassium: 4.3 mmol/L (ref 3.5–5.2)
Sodium: 141 mmol/L (ref 134–144)
Total Protein: 7.2 g/dL (ref 6.0–8.5)

## 2019-01-29 LAB — HEMOGLOBIN A1C
Est. average glucose Bld gHb Est-mCnc: 146 mg/dL
Hgb A1c MFr Bld: 6.7 % — ABNORMAL HIGH (ref 4.8–5.6)

## 2019-01-29 LAB — TSH: TSH: 0.909 u[IU]/mL (ref 0.450–4.500)

## 2019-02-02 LAB — CYTOLOGY - PAP
Diagnosis: NEGATIVE
HPV: NOT DETECTED

## 2019-02-11 DIAGNOSIS — Z1211 Encounter for screening for malignant neoplasm of colon: Secondary | ICD-10-CM | POA: Diagnosis not present

## 2019-02-23 LAB — FECAL OCCULT BLOOD, IMMUNOCHEMICAL: Fecal Occult Bld: NEGATIVE

## 2019-04-19 DIAGNOSIS — I5022 Chronic systolic (congestive) heart failure: Secondary | ICD-10-CM | POA: Diagnosis not present

## 2019-04-20 ENCOUNTER — Other Ambulatory Visit: Payer: Self-pay | Admitting: Internal Medicine

## 2019-04-20 DIAGNOSIS — I428 Other cardiomyopathies: Secondary | ICD-10-CM

## 2019-05-05 DIAGNOSIS — Z0189 Encounter for other specified special examinations: Secondary | ICD-10-CM | POA: Diagnosis not present

## 2019-05-05 DIAGNOSIS — R001 Bradycardia, unspecified: Secondary | ICD-10-CM | POA: Diagnosis not present

## 2019-05-05 DIAGNOSIS — I5023 Acute on chronic systolic (congestive) heart failure: Secondary | ICD-10-CM | POA: Diagnosis not present

## 2019-05-05 DIAGNOSIS — I480 Paroxysmal atrial fibrillation: Secondary | ICD-10-CM | POA: Diagnosis not present

## 2019-05-27 ENCOUNTER — Telehealth: Payer: BC Managed Care – PPO | Admitting: Physician Assistant

## 2019-05-27 ENCOUNTER — Encounter: Payer: Self-pay | Admitting: Internal Medicine

## 2019-05-27 DIAGNOSIS — J019 Acute sinusitis, unspecified: Secondary | ICD-10-CM

## 2019-05-27 MED ORDER — AMOXICILLIN-POT CLAVULANATE 875-125 MG PO TABS: 1.0000 | ORAL_TABLET | Freq: Two times a day (BID) | ORAL | 0 refills | Status: DC

## 2019-05-27 NOTE — Progress Notes (Signed)

## 2019-06-02 ENCOUNTER — Telehealth: Payer: Self-pay | Admitting: Physician Assistant

## 2019-06-02 DIAGNOSIS — R0602 Shortness of breath: Secondary | ICD-10-CM

## 2019-06-02 DIAGNOSIS — R059 Cough, unspecified: Secondary | ICD-10-CM

## 2019-06-02 DIAGNOSIS — R05 Cough: Secondary | ICD-10-CM

## 2019-06-02 NOTE — Progress Notes (Signed)
Based on what you shared with me, I feel your condition warrants further evaluation and I recommend that you be seen for a face to face office visit.  NOTE: If you entered your credit card information for this eVisit, you will not be charged. You may see a "hold" on your card for the $35 but that hold will drop off and you will not have a charge processed.  If you are having a true medical emergency please call 911.     For an urgent face to face visit, [place] has four urgent care centers for your convenience:   . [facility]    [phone]                  Get Driving Directions  [address], [place] [address] . 10 am to 8 pm Monday-Friday . 12 pm to 8 pm Saturday-Sunday   . [facility]                   Get Driving Directions  [address], [place], [place] [address] . 8 am to 8 pm Monday-Friday . 9 am to 6 pm Saturday . 11 am to 6 pm Sunday   . [facility] at [place]                  Get Driving Directions   [address] [place], [place] [address] . 8 am to 8 pm Monday-Friday . 8 am to 4 pm Saturday-Sunday    . [facility] at                     Get Driving Directions  [phone]  [address], [place], [place] [address]  . Monday-Friday, 12 PM to 6 PM    Your e-visit answers were reviewed by a board certified advanced clinical practitioner to complete your personal care plan.  Thank you for using e-Visits.  Approximately 5 minutes was spent documenting and reviewing patient's chart.

## 2019-06-09 DIAGNOSIS — R0602 Shortness of breath: Secondary | ICD-10-CM | POA: Diagnosis not present

## 2019-06-09 DIAGNOSIS — I10 Essential (primary) hypertension: Secondary | ICD-10-CM | POA: Diagnosis not present

## 2019-06-09 DIAGNOSIS — E119 Type 2 diabetes mellitus without complications: Secondary | ICD-10-CM | POA: Diagnosis not present

## 2019-06-09 DIAGNOSIS — I5022 Chronic systolic (congestive) heart failure: Secondary | ICD-10-CM | POA: Diagnosis not present

## 2019-06-20 ENCOUNTER — Encounter: Payer: Self-pay | Admitting: Internal Medicine

## 2019-06-20 NOTE — Telephone Encounter (Signed)
Please advise 

## 2019-07-19 DIAGNOSIS — I5022 Chronic systolic (congestive) heart failure: Secondary | ICD-10-CM | POA: Diagnosis not present

## 2019-08-09 DIAGNOSIS — E1165 Type 2 diabetes mellitus with hyperglycemia: Secondary | ICD-10-CM | POA: Diagnosis not present

## 2019-08-09 DIAGNOSIS — Z794 Long term (current) use of insulin: Secondary | ICD-10-CM | POA: Diagnosis not present

## 2019-08-09 LAB — HEMOGLOBIN A1C: Hemoglobin A1C: 6.8

## 2019-08-15 DIAGNOSIS — E119 Type 2 diabetes mellitus without complications: Secondary | ICD-10-CM | POA: Diagnosis not present

## 2019-08-15 DIAGNOSIS — Z794 Long term (current) use of insulin: Secondary | ICD-10-CM | POA: Diagnosis not present

## 2019-10-02 ENCOUNTER — Other Ambulatory Visit: Payer: Self-pay | Admitting: Internal Medicine

## 2019-10-15 ENCOUNTER — Other Ambulatory Visit: Payer: Self-pay | Admitting: Internal Medicine

## 2019-10-15 DIAGNOSIS — I428 Other cardiomyopathies: Secondary | ICD-10-CM

## 2019-11-23 ENCOUNTER — Encounter: Payer: Self-pay | Admitting: Internal Medicine

## 2019-12-13 DIAGNOSIS — I428 Other cardiomyopathies: Secondary | ICD-10-CM | POA: Diagnosis not present

## 2019-12-13 DIAGNOSIS — Z95810 Presence of automatic (implantable) cardiac defibrillator: Secondary | ICD-10-CM | POA: Diagnosis not present

## 2019-12-13 DIAGNOSIS — I5022 Chronic systolic (congestive) heart failure: Secondary | ICD-10-CM | POA: Diagnosis not present

## 2019-12-13 DIAGNOSIS — Z79899 Other long term (current) drug therapy: Secondary | ICD-10-CM | POA: Diagnosis not present

## 2020-01-24 DIAGNOSIS — I428 Other cardiomyopathies: Secondary | ICD-10-CM | POA: Diagnosis not present

## 2020-01-31 ENCOUNTER — Encounter: Payer: Self-pay | Admitting: Internal Medicine

## 2020-01-31 ENCOUNTER — Ambulatory Visit (INDEPENDENT_AMBULATORY_CARE_PROVIDER_SITE_OTHER): Payer: BC Managed Care – PPO | Admitting: Internal Medicine

## 2020-01-31 ENCOUNTER — Other Ambulatory Visit: Payer: Self-pay

## 2020-01-31 VITALS — BP 130/70 | HR 87 | Temp 98.3°F | Ht 62.0 in | Wt 158.0 lb

## 2020-01-31 DIAGNOSIS — Z780 Asymptomatic menopausal state: Secondary | ICD-10-CM

## 2020-01-31 DIAGNOSIS — E118 Type 2 diabetes mellitus with unspecified complications: Secondary | ICD-10-CM

## 2020-01-31 DIAGNOSIS — Z1211 Encounter for screening for malignant neoplasm of colon: Secondary | ICD-10-CM | POA: Diagnosis not present

## 2020-01-31 DIAGNOSIS — E785 Hyperlipidemia, unspecified: Secondary | ICD-10-CM | POA: Diagnosis not present

## 2020-01-31 DIAGNOSIS — I10 Essential (primary) hypertension: Secondary | ICD-10-CM | POA: Diagnosis not present

## 2020-01-31 DIAGNOSIS — I428 Other cardiomyopathies: Secondary | ICD-10-CM

## 2020-01-31 DIAGNOSIS — E1169 Type 2 diabetes mellitus with other specified complication: Secondary | ICD-10-CM | POA: Diagnosis not present

## 2020-01-31 DIAGNOSIS — Z1231 Encounter for screening mammogram for malignant neoplasm of breast: Secondary | ICD-10-CM | POA: Diagnosis not present

## 2020-01-31 DIAGNOSIS — Z0000 Encounter for general adult medical examination without abnormal findings: Secondary | ICD-10-CM

## 2020-01-31 LAB — POCT URINALYSIS DIPSTICK
Bilirubin, UA: NEGATIVE
Blood, UA: NEGATIVE
Glucose, UA: POSITIVE — AB
Ketones, UA: NEGATIVE
Leukocytes, UA: NEGATIVE
Nitrite, UA: NEGATIVE
Protein, UA: NEGATIVE
Spec Grav, UA: 1.01 (ref 1.010–1.025)
Urobilinogen, UA: 0.2 E.U./dL
pH, UA: 6 (ref 5.0–8.0)

## 2020-01-31 NOTE — Progress Notes (Signed)
Date:  01/31/2020   Name:  Lori Brewer   DOB:  05-02-1961   MRN:  [id number]   Chief Complaint: Annual Exam (Breast Exam. No pap. ) Lori Brewer is a 59 y.o. female who presents today for her Complete Annual Exam. She feels fairly well. She reports exercising walking some. She reports she is sleeping fairly well. She denies breast issues.  Mammogram  05/2015 Pap  01/2019 Colonoscopy  FIT 02/11/2019 Immunization History  Administered Date(s) Administered  . Influenza-Unspecified 06/13/2016  . PFIZER SARS-COV-2 Vaccination 11/29/2019, 12/20/2019  . Pneumococcal Polysaccharide-23 12/10/2017    Diabetes She presents for her follow-up (followed by Endo) diabetic visit. She has type 2 diabetes mellitus. Pertinent negatives for hypoglycemia include no dizziness, headaches, nervousness/anxiousness or tremors. Pertinent negatives for diabetes include no chest pain, no fatigue, no polydipsia and no polyuria. Current diabetic treatment includes insulin injections. She is compliant with treatment all of the time. An ACE inhibitor/angiotensin II receptor blocker is being taken. Eye exam is not current.  Hyperlipidemia The problem is controlled. Associated symptoms include shortness of breath (with exertion). Pertinent negatives include no chest pain. Current antihyperlipidemic treatment includes statins. The current treatment provides significant improvement of lipids.  Menopausal sx - on HRT with good control.  However, has not had a mammogram in 5 years. She has good control on MWF dosing but has not been able to stop completely.  Cardiomyopathy - followed by Cardiology at [facility].  On lasix, Enstresto and Coreg. Doing well with stable DOE that resolves quickly with rest.  No chest pain or palpitations.  AICD is in place.  Lab Results  Component Value Date   CREATININE 0.86 01/28/2019   BUN 11 01/28/2019   NA 141 01/28/2019   K 4.3 01/28/2019   CL 98 01/28/2019   CO2 26 01/28/2019   Lab Results    Component Value Date   CHOL 158 01/28/2019   HDL 33 (L) 01/28/2019   LDLCALC 87 01/28/2019   TRIG 190 (H) 01/28/2019   CHOLHDL 4.8 (H) 01/28/2019   Lab Results  Component Value Date   TSH 0.909 01/28/2019   Lab Results  Component Value Date   HGBA1C 6.8 08/09/2019   Lab Results  Component Value Date   WBC 7.5 01/28/2019   HGB 13.4 01/28/2019   HCT 39.6 01/28/2019   MCV 89 01/28/2019   PLT 304 01/28/2019   Lab Results  Component Value Date   ALT 18 01/28/2019   AST 17 01/28/2019   ALKPHOS 139 (H) 01/28/2019   BILITOT 0.7 01/28/2019     Review of Systems  Constitutional: Negative for chills, fatigue and fever.  HENT: Negative for congestion, hearing loss, tinnitus, trouble swallowing and voice change.   Eyes: Negative for visual disturbance.  Respiratory: Positive for shortness of breath (with exertion). Negative for cough, chest tightness and wheezing.   Cardiovascular: Negative for chest pain, palpitations and leg swelling.  Gastrointestinal: Negative for abdominal pain, blood in stool, constipation, diarrhea and vomiting.  Endocrine: Negative for polydipsia and polyuria.  Genitourinary: Negative for dysuria, frequency, genital sores, vaginal bleeding and vaginal discharge.  Musculoskeletal: Negative for arthralgias, gait problem and joint swelling.  Skin: Negative for color change and rash.  Allergic/Immunologic: Negative for environmental allergies.  Neurological: Negative for dizziness, tremors, light-headedness and headaches.  Hematological: Negative for adenopathy. Does not bruise/bleed easily.  Psychiatric/Behavioral: Negative for dysphoric mood and sleep disturbance. The patient is not nervous/anxious.     Patient Active Problem  List   Diagnosis Date Noted  . Generalized anxiety disorder 05/15/2017  . Environmental and seasonal allergies 05/15/2017  . Elevated WBC count 07/03/2016  . Biventricular ICD (implantable cardioverter-defibrillator) in place  05/16/2016  . Non-ischemic cardiomyopathy (HCC) 07/14/2015  . Interstitial lung disease (HCC) 06/19/2015  . Menopause 05/15/2015  . Anxiety, mild 03/30/2015  . Type II diabetes mellitus with complication (HCC) 03/30/2015  . Essential (primary) hypertension 03/30/2015  . Hyperlipidemia associated with type 2 diabetes mellitus (HCC) 03/30/2015    Allergies  Allergen Reactions  . Januvia [Sitagliptin] Nausea Only    Past Surgical History:  Procedure Laterality Date  . CARDIAC CATHETERIZATION N/A 07/12/2015   Procedure: Right and Left Heart Cath;  Surgeon: Alwyn Pea, MD;  Location: [facility] INVASIVE CV LAB;  Service: Cardiovascular;  Laterality: N/A;  . CARDIAC DEFIBRILLATOR PLACEMENT  01/2016  . CESAREAN SECTION    . GALLBLADDER SURGERY      Social History   Tobacco Use  . Smoking status: Former Games developer  . Smokeless tobacco: Never Used  Substance Use Topics  . Alcohol use: Yes    Alcohol/week: 12.0 standard drinks    Types: 12 Cans of beer per week    Comment: occasional  . Drug use: No     Medication list has been reviewed and updated.  Current Meds  Medication Sig  . albuterol (VENTOLIN HFA) 108 (90 Base) MCG/ACT inhaler Inhale 2 puffs into the lungs every 6 (six) hours as needed for wheezing or shortness of breath.  Marland Kitchen atorvastatin (LIPITOR) 20 MG tablet TAKE 1 TABLET AT BEDTIME  . B-D ULTRAFINE III SHORT PEN 31G X 8 MM MISC   . carvedilol (COREG) 3.125 MG tablet Take 1 tablet by mouth 2 (two) times daily.  Marland Kitchen ENTRESTO 24-26 MG Take 1 tablet by mouth 2 (two) times daily.  Marland Kitchen estradiol (ESTRACE) 0.5 MG tablet TAKE 1 TABLET DAILY  . furosemide (LASIX) 40 MG tablet Take 40 mg by mouth daily.  Marland Kitchen glucose blood test strip Use as instructed  . insulin degludec (TRESIBA FLEXTOUCH) 100 UNIT/ML SOPN FlexTouch Pen Inject 20 Units into the skin daily at 10 pm.  . lisinopril (PRINIVIL,ZESTRIL) 40 MG tablet Take 1 tablet by mouth daily.  . medroxyPROGESTERone (PROVERA) 2.5 MG  tablet TAKE 1 TABLET DAILY  . ONETOUCH DELICA LANCETS 33G MISC   . OZEMPIC, 0.25 OR 0.5 MG/DOSE, 2 MG/1.5ML SOPN once a week.     PHQ 2/9 Scores 01/31/2020 01/28/2019 12/28/2018 12/10/2017  PHQ - 2 Score 0 0 1 0  PHQ- 9 Score 0 0 - -    BP Readings from Last 3 Encounters:  01/31/20 130/70  01/28/19 119/69  12/28/18 140/70    Physical Exam Vitals and nursing note reviewed.  Constitutional:      General: She is not in acute distress.    Appearance: She is well-developed.  HENT:     Head: Normocephalic and atraumatic.     Right Ear: Tympanic membrane and ear canal normal.     Left Ear: Tympanic membrane and ear canal normal.     Nose:     Right Sinus: No maxillary sinus tenderness.     Left Sinus: No maxillary sinus tenderness.  Eyes:     General: No scleral icterus.       Right eye: No discharge.        Left eye: No discharge.     Conjunctiva/sclera: Conjunctivae normal.  Neck:     Thyroid: No thyromegaly.  Vascular: No carotid bruit.  Cardiovascular:     Rate and Rhythm: Normal rate and regular rhythm.     Pulses: Normal pulses.     Heart sounds: Normal heart sounds.  Pulmonary:     Effort: Pulmonary effort is normal. No respiratory distress.     Breath sounds: No wheezing.  Chest:     Breasts:        Right: No mass, nipple discharge, skin change or tenderness.        Left: No mass, nipple discharge, skin change or tenderness.       Comments: AICD site intact Abdominal:     General: Bowel sounds are normal.     Palpations: Abdomen is soft.     Tenderness: There is no abdominal tenderness.  Musculoskeletal:        General: Normal range of motion.     Cervical back: Normal range of motion. No erythema.     Right lower leg: No edema.     Left lower leg: No edema.  Lymphadenopathy:     Cervical: No cervical adenopathy.  Skin:    General: Skin is warm and dry.     Capillary Refill: Capillary refill takes less than 2 seconds.     Findings: No rash.    Neurological:     General: No focal deficit present.     Mental Status: She is alert and oriented to person, place, and time.     Cranial Nerves: No cranial nerve deficit.     Sensory: No sensory deficit.     Deep Tendon Reflexes: Reflexes are normal and symmetric.  Psychiatric:        Attention and Perception: Attention normal.        Mood and Affect: Mood normal.        Speech: Speech normal.     Wt Readings from Last 3 Encounters:  01/31/20 158 lb (71.7 kg)  01/28/19 156 lb (70.8 kg)  12/28/18 160 lb (72.6 kg)    BP 130/70   Pulse 87   Temp 98.3 F (36.8 C) (Oral)   Ht 5\' 2"  (1.575 m)   Wt 158 lb (71.7 kg)   SpO2 98%   BMI 28.90 kg/m   Assessment and Plan: 1. Annual physical exam Normal exam Continue healthy diet, exercise as able - POCT urinalysis dipstick  2. Encounter for screening mammogram for breast cancer To be scheduled at [facility] - MM 3D SCREEN BREAST BILATERAL; Future  3. Colon cancer screening - Fecal occult blood, imunochemical  4. Menopause Doing well on low dose HRT Continue lowest effective dose - MWF  5. Hyperlipidemia associated with type 2 diabetes mellitus (HCC) Tolerating statin medication without side effects at this time LDL is at goal of < 70 on current dose Continue same therapy without change at this time. - Lipid panel - Comprehensive metabolic panel  6. Essential (primary) hypertension Clinically stable exam with well controlled BP on coreg and lasix. Tolerating medications without side effects at this time. Pt to continue current regimen and low sodium diet; benefits of regular exercise as able discussed. - TSH  7. Non-ischemic cardiomyopathy (HCC) Stable mild symptoms. Followed by Cardiology.  8. Type II diabetes mellitus with complication (HCC) Followed by endo Recent A1C was good.    Partially dictated using [facility]. Any errors are unintentional.  Animal nutritionist, MD [facility]  01/31/2020

## 2020-02-01 LAB — COMPREHENSIVE METABOLIC PANEL
ALT: 10 IU/L (ref 0–32)
AST: 13 IU/L (ref 0–40)
Albumin/Globulin Ratio: 1.8 (ref 1.2–2.2)
Albumin: 4.4 g/dL (ref 3.8–4.9)
Alkaline Phosphatase: 169 IU/L — ABNORMAL HIGH (ref 48–121)
BUN/Creatinine Ratio: 15 (ref 9–23)
BUN: 12 mg/dL (ref 6–24)
Bilirubin Total: 0.4 mg/dL (ref 0.0–1.2)
CO2: 23 mmol/L (ref 20–29)
Calcium: 9.1 mg/dL (ref 8.7–10.2)
Chloride: 99 mmol/L (ref 96–106)
Creatinine, Ser: 0.82 mg/dL (ref 0.57–1.00)
GFR calc Af Amer: 91 mL/min/{1.73_m2} (ref >59)
GFR calc non Af Amer: 79 mL/min/{1.73_m2} (ref >59)
Globulin, Total: 2.5 g/dL (ref 1.5–4.5)
Glucose: 171 mg/dL — ABNORMAL HIGH (ref 65–99)
Potassium: 4.3 mmol/L (ref 3.5–5.2)
Sodium: 140 mmol/L (ref 134–144)
Total Protein: 6.9 g/dL (ref 6.0–8.5)

## 2020-02-01 LAB — LIPID PANEL
Chol/HDL Ratio: 4 ratio (ref 0.0–4.4)
Cholesterol, Total: 164 mg/dL (ref 100–199)
HDL: 41 mg/dL (ref >39)
LDL Chol Calc (NIH): 95 mg/dL (ref 0–99)
Triglycerides: 160 mg/dL — ABNORMAL HIGH (ref 0–149)
VLDL Cholesterol Cal: 28 mg/dL (ref 5–40)

## 2020-02-01 LAB — TSH: TSH: 1.84 u[IU]/mL (ref 0.450–4.500)

## 2020-02-16 DIAGNOSIS — I428 Other cardiomyopathies: Secondary | ICD-10-CM | POA: Diagnosis not present

## 2020-02-16 DIAGNOSIS — I480 Paroxysmal atrial fibrillation: Secondary | ICD-10-CM | POA: Diagnosis not present

## 2020-02-16 DIAGNOSIS — Z95810 Presence of automatic (implantable) cardiac defibrillator: Secondary | ICD-10-CM | POA: Diagnosis not present

## 2020-02-16 DIAGNOSIS — I5022 Chronic systolic (congestive) heart failure: Secondary | ICD-10-CM | POA: Diagnosis not present

## 2020-02-28 ENCOUNTER — Encounter: Payer: Self-pay | Admitting: Internal Medicine

## 2020-03-13 DIAGNOSIS — E119 Type 2 diabetes mellitus without complications: Secondary | ICD-10-CM | POA: Diagnosis not present

## 2020-03-13 DIAGNOSIS — Z794 Long term (current) use of insulin: Secondary | ICD-10-CM | POA: Diagnosis not present

## 2020-03-20 DIAGNOSIS — Z794 Long term (current) use of insulin: Secondary | ICD-10-CM | POA: Diagnosis not present

## 2020-03-20 DIAGNOSIS — E119 Type 2 diabetes mellitus without complications: Secondary | ICD-10-CM | POA: Diagnosis not present

## 2020-04-17 ENCOUNTER — Encounter: Payer: Self-pay | Admitting: Internal Medicine

## 2020-04-17 NOTE — Telephone Encounter (Signed)
Please Advise.  KP

## 2020-04-29 ENCOUNTER — Other Ambulatory Visit: Payer: Self-pay | Admitting: Internal Medicine

## 2020-04-29 DIAGNOSIS — I428 Other cardiomyopathies: Secondary | ICD-10-CM

## 2020-05-16 DIAGNOSIS — E119 Type 2 diabetes mellitus without complications: Secondary | ICD-10-CM | POA: Diagnosis not present

## 2020-05-16 DIAGNOSIS — Z794 Long term (current) use of insulin: Secondary | ICD-10-CM | POA: Diagnosis not present

## 2020-05-17 DIAGNOSIS — I480 Paroxysmal atrial fibrillation: Secondary | ICD-10-CM | POA: Diagnosis not present

## 2020-05-17 DIAGNOSIS — I5022 Chronic systolic (congestive) heart failure: Secondary | ICD-10-CM | POA: Diagnosis not present

## 2020-05-17 DIAGNOSIS — I428 Other cardiomyopathies: Secondary | ICD-10-CM | POA: Diagnosis not present

## 2020-05-17 DIAGNOSIS — Z95810 Presence of automatic (implantable) cardiac defibrillator: Secondary | ICD-10-CM | POA: Diagnosis not present

## 2020-05-29 DIAGNOSIS — I5022 Chronic systolic (congestive) heart failure: Secondary | ICD-10-CM | POA: Diagnosis not present

## 2020-06-20 DIAGNOSIS — Z794 Long term (current) use of insulin: Secondary | ICD-10-CM | POA: Diagnosis not present

## 2020-06-20 DIAGNOSIS — E119 Type 2 diabetes mellitus without complications: Secondary | ICD-10-CM | POA: Diagnosis not present

## 2020-06-20 LAB — LIPID PANEL
Cholesterol: 163 (ref 0–200)
HDL: 35 (ref 35–70)
LDL Cholesterol: 82
Triglycerides: 227 — AB (ref 40–160)

## 2020-06-20 LAB — HEMOGLOBIN A1C: Hemoglobin A1C: 7

## 2020-06-27 DIAGNOSIS — Z794 Long term (current) use of insulin: Secondary | ICD-10-CM | POA: Diagnosis not present

## 2020-06-27 DIAGNOSIS — E119 Type 2 diabetes mellitus without complications: Secondary | ICD-10-CM | POA: Diagnosis not present

## 2020-09-03 ENCOUNTER — Other Ambulatory Visit: Payer: Self-pay | Admitting: Internal Medicine

## 2020-09-18 DIAGNOSIS — I5022 Chronic systolic (congestive) heart failure: Secondary | ICD-10-CM | POA: Diagnosis not present

## 2020-10-11 ENCOUNTER — Ambulatory Visit: Payer: BC Managed Care – PPO | Admitting: Internal Medicine

## 2020-10-11 ENCOUNTER — Encounter: Payer: Self-pay | Admitting: Internal Medicine

## 2020-10-11 ENCOUNTER — Ambulatory Visit
Admission: RE | Admit: 2020-10-11 | Discharge: 2020-10-11 | Disposition: A | Discharge status: Home / Self Care | Payer: BC Managed Care – PPO | Source: Ambulatory Visit | Attending: Internal Medicine | Admitting: Internal Medicine

## 2020-10-11 ENCOUNTER — Other Ambulatory Visit: Payer: Self-pay

## 2020-10-11 ENCOUNTER — Ambulatory Visit
Admission: RE | Admit: 2020-10-11 | Discharge: 2020-10-11 | Disposition: A | Discharge status: Home / Self Care | Payer: BC Managed Care – PPO | Attending: Internal Medicine | Admitting: Internal Medicine

## 2020-10-11 VITALS — BP 122/68 | HR 77 | Temp 98.2°F | Ht 62.0 in | Wt 160.0 lb

## 2020-10-11 DIAGNOSIS — J1282 Pneumonia due to coronavirus disease 2019: Secondary | ICD-10-CM

## 2020-10-11 DIAGNOSIS — U071 COVID-19: Secondary | ICD-10-CM | POA: Diagnosis not present

## 2020-10-11 DIAGNOSIS — E118 Type 2 diabetes mellitus with unspecified complications: Secondary | ICD-10-CM

## 2020-10-11 DIAGNOSIS — R059 Cough, unspecified: Secondary | ICD-10-CM | POA: Diagnosis not present

## 2020-10-11 DIAGNOSIS — R0602 Shortness of breath: Secondary | ICD-10-CM | POA: Diagnosis not present

## 2020-10-11 MED ORDER — PROMETHAZINE-DM 6.25-15 MG/5ML PO SYRP: 5.0000 mL | ORAL_SOLUTION | Freq: Four times a day (QID) | ORAL | 0 refills | Status: DC | PRN

## 2020-10-11 MED ORDER — AZITHROMYCIN 250 MG PO TABS: ORAL_TABLET | ORAL | 0 refills | Status: AC

## 2020-10-11 NOTE — Patient Instructions (Signed)
Use Breztri 1or 2 inhalations twice a day  Continue to use the albuterol inhaler every 3-4 hours as needed

## 2020-10-11 NOTE — Progress Notes (Signed)
Date:  10/11/2020   Name:  Lori Brewer   DOB:  08-22-1961   MRN:  [id number]   Chief Complaint: Cough (Covid on 18th, Trouble breathing feels like shes gasping for air , fatigue, wheezing, )  Shortness of Breath This is a new problem. The current episode started in the past 7 days. The problem occurs constantly. The problem has been gradually worsening (seemed to improved from Covid sx then SOB and cough began to worsen). Associated symptoms include chest pain and wheezing. Pertinent negatives include no ear pain, fever, headaches, leg swelling, sore throat, swollen glands or vomiting. She has tried beta agonist inhalers for the symptoms. The treatment provided mild relief.    Lab Results  Component Value Date   CREATININE 0.82 01/31/2020   BUN 12 01/31/2020   NA 140 01/31/2020   K 4.3 01/31/2020   CL 99 01/31/2020   CO2 23 01/31/2020   Lab Results  Component Value Date   CHOL 164 01/31/2020   HDL 41 01/31/2020   LDLCALC 95 01/31/2020   TRIG 160 (H) 01/31/2020   CHOLHDL 4.0 01/31/2020   Lab Results  Component Value Date   TSH 1.840 01/31/2020   Lab Results  Component Value Date   HGBA1C 6.8 08/09/2019   Lab Results  Component Value Date   WBC 7.5 01/28/2019   HGB 13.4 01/28/2019   HCT 39.6 01/28/2019   MCV 89 01/28/2019   PLT 304 01/28/2019   Lab Results  Component Value Date   ALT 10 01/31/2020   AST 13 01/31/2020   ALKPHOS 169 (H) 01/31/2020   BILITOT 0.4 01/31/2020     Review of Systems  Constitutional: Negative for chills, diaphoresis and fever.  HENT: Negative for congestion, ear pain, sore throat and trouble swallowing.   Respiratory: Positive for shortness of breath and wheezing. Negative for chest tightness.   Cardiovascular: Positive for chest pain. Negative for leg swelling.  Gastrointestinal: Negative for anal bleeding, rectal pain and vomiting.  Neurological: Negative for dizziness and headaches.    Patient Active Problem List   Diagnosis  Date Noted  . Generalized anxiety disorder 05/15/2017  . Environmental and seasonal allergies 05/15/2017  . Elevated WBC count 07/03/2016  . 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 Non-ischemic cardiomyopathy (HCC) 07/14/2015  . Interstitial lung disease (HCC) 06/19/2015  . Menopause 05/15/2015  . Anxiety, mild 03/30/2015  . Type II diabetes mellitus with complication (HCC) 03/30/2015  . Essential (primary) hypertension 03/30/2015  . Hyperlipidemia associated with type 2 diabetes mellitus (HCC) 03/30/2015    Allergies  Allergen Reactions  . Januvia [Sitagliptin] Nausea Only    Past Surgical History:  Procedure Laterality Date  . CARDIAC CATHETERIZATION N/A 07/12/2015   Procedure: Right and Left Heart Cath;  Surgeon: Alwyn Pea, MD;  Location: [facility] INVASIVE CV LAB;  Service: Cardiovascular;  Laterality: N/A;  . CARDIAC DEFIBRILLATOR PLACEMENT  01/2016  . CESAREAN SECTION    . GALLBLADDER SURGERY      Social History   Tobacco Use  . Smoking status: Former Games developer  . Smokeless tobacco: Never Used  Substance Use Topics  . Alcohol use: Yes    Alcohol/week: 12.0 standard drinks    Types: 12 Cans of beer per week    Comment: occasional  . Drug use: No     Medication list has been reviewed and updated.  Current Meds  Medication Sig  . albuterol (VENTOLIN HFA) 108 (90 Base) MCG/ACT inhaler Inhale 2  puffs into the lungs every 6 (six) hours as needed for wheezing or shortness of breath.  Marland Kitchen atorvastatin (LIPITOR) 20 MG tablet TAKE 1 TABLET AT BEDTIME  . B-D ULTRAFINE III SHORT PEN 31G X 8 MM MISC   . carvedilol (COREG) 3.125 MG tablet Take 1 tablet by mouth 2 (two) times daily.  Marland Kitchen ENTRESTO 24-26 MG Take 1 tablet by mouth 2 (two) times daily.  Marland Kitchen estradiol (ESTRACE) 0.5 MG tablet TAKE 1 TABLET DAILY  . furosemide (LASIX) 40 MG tablet Take 40 mg by mouth daily.  Marland Kitchen glucose blood test strip Use as instructed  . insulin degludec  (TRESIBA) 100 UNIT/ML FlexTouch Pen Inject 20 Units into the skin daily at 10 pm.  . lisinopril (PRINIVIL,ZESTRIL) 40 MG tablet Take 1 tablet by mouth daily.  . medroxyPROGESTERone (PROVERA) 2.5 MG tablet TAKE 1 TABLET DAILY  . ONETOUCH DELICA LANCETS 33G MISC     PHQ 2/9 Scores 10/11/2020 01/31/2020 01/28/2019 12/28/2018  PHQ - 2 Score 0 0 0 1  PHQ- 9 Score 0 0 0 -    GAD 7 : Generalized Anxiety Score 10/11/2020 01/31/2020  Nervous, Anxious, on Edge 0 0  Control/stop worrying 0 0  Worry too much - different things 0 0  Trouble relaxing 0 0  Restless 0 0  Easily annoyed or irritable 0 0  Afraid - awful might happen 0 0  Total GAD 7 Score 0 0  Anxiety Difficulty - Not difficult at all    BP Readings from Last 3 Encounters:  10/11/20 122/68  01/31/20 130/70  01/28/19 119/69    Physical Exam Vitals and nursing note reviewed.  Constitutional:      General: She is not in acute distress.    Appearance: Normal appearance. She is well-developed.  HENT:     Head: Normocephalic and atraumatic.  Cardiovascular:     Rate and Rhythm: Normal rate and regular rhythm.  Pulmonary:     Effort: Pulmonary effort is normal. No respiratory distress.     Breath sounds: Transmitted upper airway sounds present. No wheezing.  Musculoskeletal:        General: Normal range of motion.     Cervical back: Normal range of motion.  Lymphadenopathy:     Cervical: No cervical adenopathy.  Skin:    General: Skin is warm and dry.     Findings: No rash.  Neurological:     Mental Status: She is alert and oriented to person, place, and time.  Psychiatric:        Mood and Affect: Mood and affect normal.        Behavior: Behavior normal.        Thought Content: Thought content normal.     Wt Readings from Last 3 Encounters:  10/11/20 160 lb (72.6 kg)  01/31/20 158 lb (71.7 kg)  01/28/19 156 lb (70.8 kg)    BP 122/68   Pulse 77   Temp 98.2 F (36.8 C) (Oral)   Ht 5\' 2"  (1.575 m)   Wt 160 lb (72.6  kg)   SpO2 95%   BMI 29.26 kg/m   Assessment and Plan: 1. Pneumonia due to COVID-19 virus Suspect bacterial or viral pneumonia following acute infection Will treat with antibiotics for possible bacterial etiology Sample of Breztri 1-2 inhalations bid Continue ventolin PRN - DG Chest 2 View; Future - azithromycin (ZITHROMAX Z-PAK) 250 MG tablet; UAD  Dispense: 6 each; Refill: 0 - promethazine-dextromethorphan (PROMETHAZINE-DM) 6.25-15 MG/5ML syrup; Take 5 mLs by mouth  4 (four) times daily as needed for cough.  Dispense: 118 mL; Refill: 0  2. Type II diabetes mellitus with complication (HCC) Begin followed by Endocrinology. Last A1C 7.0   Partially dictated using Animal nutritionist. Any errors are unintentional.  Bari Edward, MD [facility]  10/11/2020

## 2020-10-24 ENCOUNTER — Other Ambulatory Visit: Payer: Self-pay | Admitting: Internal Medicine

## 2020-10-24 DIAGNOSIS — I428 Other cardiomyopathies: Secondary | ICD-10-CM

## 2020-10-24 NOTE — Telephone Encounter (Signed)
   Notes to clinic:  script is expired  Review for refill    Requested Prescriptions  Pending Prescriptions Disp Refills   estradiol (ESTRACE) 0.5 MG tablet [Pharmacy Med Name: ESTRADIOL TAB 0.5MG ] 90 tablet 1    Sig: TAKE 1 TABLET DAILY      OB/GYN:  Estrogens Failed - 10/24/2020  8:14 AM      Failed - Mammogram is up-to-date per Health Maintenance      Passed - Last BP in normal range    BP Readings from Last 1 Encounters:  10/11/20 122/68          Passed - Valid encounter within last 12 months    Recent Outpatient Visits           1 week ago Pneumonia due to COVID-19 virus   [facility] Reubin Milan, MD   8 months ago Annual physical exam   [facility] Reubin Milan, MD   1 year ago Annual physical exam   [facility] Reubin Milan, MD   1 year ago Bronchospasm   [facility] Reubin Milan, MD   2 years ago Annual physical exam   [facility] Reubin Milan, MD       Future Appointments             In 3 months Judithann Graves Nyoka Cowden, MD [facility]

## 2020-11-14 DIAGNOSIS — I428 Other cardiomyopathies: Secondary | ICD-10-CM | POA: Diagnosis not present

## 2020-11-14 DIAGNOSIS — I480 Paroxysmal atrial fibrillation: Secondary | ICD-10-CM | POA: Diagnosis not present

## 2020-11-14 DIAGNOSIS — Z95810 Presence of automatic (implantable) cardiac defibrillator: Secondary | ICD-10-CM | POA: Diagnosis not present

## 2020-11-14 DIAGNOSIS — I5022 Chronic systolic (congestive) heart failure: Secondary | ICD-10-CM | POA: Diagnosis not present

## 2020-12-17 ENCOUNTER — Other Ambulatory Visit: Payer: Self-pay | Admitting: Internal Medicine

## 2020-12-25 DIAGNOSIS — E119 Type 2 diabetes mellitus without complications: Secondary | ICD-10-CM | POA: Diagnosis not present

## 2020-12-25 DIAGNOSIS — Z794 Long term (current) use of insulin: Secondary | ICD-10-CM | POA: Diagnosis not present

## 2020-12-25 LAB — MICROALBUMIN, URINE: Microalb, Ur: NORMAL

## 2020-12-25 LAB — HEMOGLOBIN A1C: Hemoglobin A1C: 8.9

## 2021-01-07 ENCOUNTER — Other Ambulatory Visit: Payer: Self-pay | Admitting: Internal Medicine

## 2021-01-07 DIAGNOSIS — I428 Other cardiomyopathies: Secondary | ICD-10-CM

## 2021-02-01 ENCOUNTER — Other Ambulatory Visit: Payer: Self-pay

## 2021-02-01 ENCOUNTER — Encounter: Payer: Self-pay | Admitting: Internal Medicine

## 2021-02-01 ENCOUNTER — Ambulatory Visit (INDEPENDENT_AMBULATORY_CARE_PROVIDER_SITE_OTHER): Payer: BC Managed Care – PPO | Admitting: Internal Medicine

## 2021-02-01 VITALS — BP 134/68 | HR 81 | Temp 98.3°F | Ht 62.0 in | Wt 162.0 lb

## 2021-02-01 DIAGNOSIS — Z780 Asymptomatic menopausal state: Secondary | ICD-10-CM | POA: Diagnosis not present

## 2021-02-01 DIAGNOSIS — E1169 Type 2 diabetes mellitus with other specified complication: Secondary | ICD-10-CM

## 2021-02-01 DIAGNOSIS — Z1211 Encounter for screening for malignant neoplasm of colon: Secondary | ICD-10-CM | POA: Diagnosis not present

## 2021-02-01 DIAGNOSIS — E118 Type 2 diabetes mellitus with unspecified complications: Secondary | ICD-10-CM | POA: Diagnosis not present

## 2021-02-01 DIAGNOSIS — Z0000 Encounter for general adult medical examination without abnormal findings: Secondary | ICD-10-CM | POA: Diagnosis not present

## 2021-02-01 DIAGNOSIS — Z1231 Encounter for screening mammogram for malignant neoplasm of breast: Secondary | ICD-10-CM | POA: Diagnosis not present

## 2021-02-01 DIAGNOSIS — E785 Hyperlipidemia, unspecified: Secondary | ICD-10-CM | POA: Diagnosis not present

## 2021-02-01 DIAGNOSIS — I428 Other cardiomyopathies: Secondary | ICD-10-CM

## 2021-02-01 DIAGNOSIS — I10 Essential (primary) hypertension: Secondary | ICD-10-CM

## 2021-02-01 LAB — POCT URINALYSIS DIPSTICK
Bilirubin, UA: NEGATIVE
Blood, UA: NEGATIVE
Glucose, UA: NEGATIVE
Ketones, UA: NEGATIVE
Leukocytes, UA: NEGATIVE
Nitrite, UA: NEGATIVE
Protein, UA: NEGATIVE
Spec Grav, UA: <=1.005 — AB (ref 1.010–1.025)
Urobilinogen, UA: 0.2 E.U./dL
pH, UA: 7 (ref 5.0–8.0)

## 2021-02-01 NOTE — Progress Notes (Signed)
Date:  02/01/2021   Name:  CHAMILLE Brewer   DOB:  22-Oct-1960   MRN:  [id number]   Chief Complaint: Annual Exam (Breast exam no pap ,foot exam )  Lori Brewer is a 60 y.o. female who presents today for her Complete Annual Exam. She feels fairly well. She reports exercising none. She reports she is sleeping fairly well. Breast complaints - none. She has several symptoms that she believes are related to her covid infection in Jan. - tingling of fingers>toes and increase in stool frequency and abdominal bloating. Her BS is also higher and Januvia was added to her regimen.  Mammogram: 05/2015 DEXA: none Pap smear: 01/2019 Colonoscopy: FIT 02/2019  Immunization History  Administered Date(s) Administered  . Influenza-Unspecified 06/13/2016, 07/09/2020  . PFIZER(Purple Top)SARS-COV-2 Vaccination 11/29/2019, 12/20/2019, 08/11/2020  . Pneumococcal Polysaccharide-23 12/10/2017    Hypertension This is a chronic problem. The problem is controlled. Pertinent negatives include no chest pain, headaches, palpitations or shortness of breath. Past treatments include beta blockers, diuretics, angiotensin blockers and ACE inhibitors. The current treatment provides significant improvement. Hypertensive end-organ damage includes CAD/MI and heart failure.  Hyperlipidemia This is a chronic problem. The problem is controlled. Pertinent negatives include no chest pain or shortness of breath. Current antihyperlipidemic treatment includes statins. The current treatment provides significant improvement of lipids.  Diabetes She presents for her follow-up diabetic visit. She has type 2 diabetes mellitus. Her disease course has been worsening (followed by Endo). Pertinent negatives for hypoglycemia include no dizziness, headaches, nervousness/anxiousness or tremors. Pertinent negatives for diabetes include no chest pain, no fatigue, no polydipsia and no polyuria. Current diabetic treatment includes insulin injections  (trulicity,januvia).  Menopause - on HRT with good control of vaso-motor symptoms.  No headaches, edema, breast issues.  She is overdue for mammogram.  Lab Results  Component Value Date   CREATININE 0.82 01/31/2020   BUN 12 01/31/2020   NA 140 01/31/2020   K 4.3 01/31/2020   CL 99 01/31/2020   CO2 23 01/31/2020   Lab Results  Component Value Date   CHOL 163 06/20/2020   HDL 35 06/20/2020   LDLCALC 82 06/20/2020   TRIG 227 (A) 06/20/2020   CHOLHDL 4.0 01/31/2020   Lab Results  Component Value Date   TSH 1.840 01/31/2020   Lab Results  Component Value Date   HGBA1C 8.9 12/25/2020   Lab Results  Component Value Date   WBC 7.5 01/28/2019   HGB 13.4 01/28/2019   HCT 39.6 01/28/2019   MCV 89 01/28/2019   PLT 304 01/28/2019   Lab Results  Component Value Date   ALT 10 01/31/2020   AST 13 01/31/2020   ALKPHOS 169 (H) 01/31/2020   BILITOT 0.4 01/31/2020     Review of Systems  Constitutional: Negative for chills, fatigue and fever.  HENT: Negative for congestion, hearing loss, tinnitus, trouble swallowing and voice change.   Eyes: Negative for visual disturbance.  Respiratory: Negative for cough, chest tightness, shortness of breath and wheezing.   Cardiovascular: Negative for chest pain, palpitations and leg swelling.  Gastrointestinal: Positive for abdominal distention. Negative for abdominal pain, constipation, diarrhea and vomiting.  Endocrine: Negative for polydipsia and polyuria.  Genitourinary: Negative for dysuria, frequency, genital sores, vaginal bleeding and vaginal discharge.  Musculoskeletal: Negative for arthralgias, gait problem and joint swelling.  Skin: Negative for color change and rash.  Neurological: Positive for numbness (in fingers and toes). Negative for dizziness, tremors, light-headedness and headaches.  Hematological: Negative for  adenopathy. Does not bruise/bleed easily.  Psychiatric/Behavioral: Negative for dysphoric mood and sleep  disturbance. The patient is not nervous/anxious.     Patient Active Problem List   Diagnosis Date Noted  . Generalized anxiety disorder 05/15/2017  . Environmental and seasonal allergies 05/15/2017  . Elevated WBC count 07/03/2016  . 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 Non-ischemic cardiomyopathy (HCC) 07/14/2015  . Small airways disease 06/19/2015  . Menopause 05/15/2015  . Anxiety, mild 03/30/2015  . Type II diabetes mellitus with complication (HCC) 03/30/2015  . Essential (primary) hypertension 03/30/2015  . Hyperlipidemia associated with type 2 diabetes mellitus (HCC) 03/30/2015    Allergies  Allergen Reactions  . Januvia [Sitagliptin] Nausea Only    Past Surgical History:  Procedure Laterality Date  . CARDIAC CATHETERIZATION N/A 07/12/2015   Procedure: Right and Left Heart Cath;  Surgeon: Alwyn Pea, MD;  Location: [facility] INVASIVE CV LAB;  Service: Cardiovascular;  Laterality: N/A;  . CARDIAC DEFIBRILLATOR PLACEMENT  01/2016  . CESAREAN SECTION    . GALLBLADDER SURGERY      Social History   Tobacco Use  . Smoking status: Former Games developer  . Smokeless tobacco: Never Used  Substance Use Topics  . Alcohol use: Yes    Alcohol/week: 12.0 standard drinks    Types: 12 Cans of beer per week    Comment: occasional  . Drug use: No     Medication list has been reviewed and updated.  Current Meds  Medication Sig  . albuterol (VENTOLIN HFA) 108 (90 Base) MCG/ACT inhaler Inhale 2 puffs into the lungs every 6 (six) hours as needed for wheezing or shortness of breath.  Marland Kitchen atorvastatin (LIPITOR) 20 MG tablet TAKE 1 TABLET AT BEDTIME  . B-D ULTRAFINE III SHORT PEN 31G X 8 MM MISC   . carvedilol (COREG) 3.125 MG tablet Take 1 tablet by mouth 2 (two) times daily.  Marland Kitchen ENTRESTO 24-26 MG Take 1 tablet by mouth 2 (two) times daily.  Marland Kitchen estradiol (ESTRACE) 0.5 MG tablet TAKE 1 TABLET DAILY  . furosemide (LASIX) 40 MG tablet Take 40 mg by  mouth daily.  Marland Kitchen glucose blood test strip Use as instructed  . insulin degludec (TRESIBA) 100 UNIT/ML FlexTouch Pen Inject 20 Units into the skin daily at 10 pm.  . JANUVIA 100 MG tablet Take 100 mg by mouth daily.  Marland Kitchen lisinopril (PRINIVIL,ZESTRIL) 40 MG tablet Take 1 tablet by mouth daily.  . medroxyPROGESTERone (PROVERA) 2.5 MG tablet TAKE 1 TABLET DAILY  . ONETOUCH DELICA LANCETS 33G MISC     PHQ 2/9 Scores 02/01/2021 10/11/2020 01/31/2020 01/28/2019  PHQ - 2 Score 0 0 0 0  PHQ- 9 Score 2 0 0 0    GAD 7 : Generalized Anxiety Score 02/01/2021 10/11/2020 01/31/2020  Nervous, Anxious, on Edge 1 0 0  Control/stop worrying 0 0 0  Worry too much - different things 0 0 0  Trouble relaxing 1 0 0  Restless 1 0 0  Easily annoyed or irritable 0 0 0  Afraid - awful might happen 1 0 0  Total GAD 7 Score 4 0 0  Anxiety Difficulty - - Not difficult at all    BP Readings from Last 3 Encounters:  02/01/21 134/68  10/11/20 122/68  01/31/20 130/70    Physical Exam Vitals and nursing note reviewed.  Constitutional:      General: She is not in acute distress.    Appearance: She is well-developed.  HENT:  Head: Normocephalic and atraumatic.     Right Ear: Tympanic membrane and ear canal normal.     Left Ear: Tympanic membrane and ear canal normal.     Nose:     Right Sinus: No maxillary sinus tenderness.     Left Sinus: No maxillary sinus tenderness.  Eyes:     General: No scleral icterus.       Right eye: No discharge.        Left eye: No discharge.     Conjunctiva/sclera: Conjunctivae normal.  Neck:     Thyroid: No thyromegaly.     Vascular: No carotid bruit.  Cardiovascular:     Rate and Rhythm: Normal rate and regular rhythm.     Pulses: Normal pulses.     Heart sounds: Normal heart sounds.  Pulmonary:     Effort: Pulmonary effort is normal. No respiratory distress.     Breath sounds: No wheezing.  Chest:  Breasts:     Right: No mass, nipple discharge, skin change or tenderness.      Left: No mass, nipple discharge, skin change or tenderness.     Abdominal:     General: Bowel sounds are normal.     Palpations: Abdomen is soft.     Tenderness: There is no abdominal tenderness.  Musculoskeletal:     Cervical back: Normal range of motion. No erythema.     Right lower leg: No edema.     Left lower leg: No edema.  Lymphadenopathy:     Cervical: No cervical adenopathy.  Skin:    General: Skin is warm and dry.     Capillary Refill: Capillary refill takes less than 2 seconds.     Findings: No rash.  Neurological:     General: No focal deficit present.     Mental Status: She is alert and oriented to person, place, and time.     Cranial Nerves: No cranial nerve deficit.     Sensory: Sensory deficit (mild decrease in fingertip sensation) present.     Deep Tendon Reflexes: Reflexes are normal and symmetric.  Psychiatric:        Attention and Perception: Attention normal.        Mood and Affect: Mood normal.     Wt Readings from Last 3 Encounters:  02/01/21 162 lb (73.5 kg)  10/11/20 160 lb (72.6 kg)  01/31/20 158 lb (71.7 kg)    BP 134/68   Pulse 81   Temp 98.3 F (36.8 C) (Oral)   Ht 5\' 2"  (1.575 m)   Wt 162 lb (73.5 kg)   SpO2 98%   BMI 29.63 kg/m   Assessment and Plan: 1. Annual physical exam Normal exam Continue healthy diet; exercise if able.  2. Encounter for screening mammogram for breast cancer Schedule at [facility] - MM 3D SCREEN BREAST BILATERAL; Future  3. Colon cancer screening - Fecal occult blood, imunochemical  4. Menopause Doing well on HRT Pt reminded to get mammograms Pap up to date  5. Non-ischemic cardiomyopathy (HCC) ICD in place; tolerating Entresto well. Unable to exercise due to cardiac limitations  6. Essential (primary) hypertension Clinically stable exam with well controlled BP. Tolerating medications without side effects at this time. Pt to continue current regimen and low sodium diet; - CBC with  Differential/Platelet - TSH - POCT urinalysis dipstick  7. Hyperlipidemia associated with type 2 diabetes mellitus (HCC) Last LDL slightly elevated on Lipitor 20 mg; will recheck and consider dose adjustment May also need  medication for triglycerides - Lipid panel  8. Type II diabetes mellitus with complication (HCC) Followed by Endo; Januvia added recently Tingling in fingers and toes could be due to recent poor control of BS but will check B12 level - Comprehensive metabolic panel - Vitamin B12   Partially dictated using Animal nutritionist. Any errors are unintentional.  Bari Edward, MD [facility]  02/01/2021

## 2021-02-01 NOTE — Patient Instructions (Signed)
Your diabetic eye exam appears to be due - please schedule this and ask the provider to send [place] office visit notes.

## 2021-02-02 LAB — LIPID PANEL
Chol/HDL Ratio: 4.2 ratio (ref 0.0–4.4)
Cholesterol, Total: 155 mg/dL (ref 100–199)
HDL: 37 mg/dL — ABNORMAL LOW (ref >39)
LDL Chol Calc (NIH): 95 mg/dL (ref 0–99)
Triglycerides: 128 mg/dL (ref 0–149)
VLDL Cholesterol Cal: 23 mg/dL (ref 5–40)

## 2021-02-02 LAB — CBC WITH DIFFERENTIAL/PLATELET
Basophils Absolute: 0.1 10*3/uL (ref 0.0–0.2)
Basos: 1 %
EOS (ABSOLUTE): 0.5 10*3/uL — ABNORMAL HIGH (ref 0.0–0.4)
Eos: 5 %
Hematocrit: 39.6 % (ref 34.0–46.6)
Hemoglobin: 13.5 g/dL (ref 11.1–15.9)
Immature Grans (Abs): 0 10*3/uL (ref 0.0–0.1)
Immature Granulocytes: 0 %
Lymphocytes Absolute: 1.9 10*3/uL (ref 0.7–3.1)
Lymphs: 20 %
MCH: 33.4 pg — ABNORMAL HIGH (ref 26.6–33.0)
MCHC: 34.1 g/dL (ref 31.5–35.7)
MCV: 98 fL — ABNORMAL HIGH (ref 79–97)
Monocytes Absolute: 0.7 10*3/uL (ref 0.1–0.9)
Monocytes: 7 %
Neutrophils Absolute: 6.5 10*3/uL (ref 1.4–7.0)
Neutrophils: 67 %
Platelets: 327 10*3/uL (ref 150–450)
RBC: 4.04 x10E6/uL (ref 3.77–5.28)
RDW: 12.1 % (ref 11.7–15.4)
WBC: 9.5 10*3/uL (ref 3.4–10.8)

## 2021-02-02 LAB — COMPREHENSIVE METABOLIC PANEL
ALT: 10 IU/L (ref 0–32)
AST: 12 IU/L (ref 0–40)
Albumin/Globulin Ratio: 1.8 (ref 1.2–2.2)
Albumin: 4.8 g/dL (ref 3.8–4.9)
Alkaline Phosphatase: 152 IU/L — ABNORMAL HIGH (ref 44–121)
BUN/Creatinine Ratio: 12 (ref 9–23)
BUN: 12 mg/dL (ref 6–24)
Bilirubin Total: 0.6 mg/dL (ref 0.0–1.2)
CO2: 22 mmol/L (ref 20–29)
Calcium: 9.4 mg/dL (ref 8.7–10.2)
Chloride: 100 mmol/L (ref 96–106)
Creatinine, Ser: 0.99 mg/dL (ref 0.57–1.00)
Globulin, Total: 2.6 g/dL (ref 1.5–4.5)
Glucose: 147 mg/dL — ABNORMAL HIGH (ref 65–99)
Potassium: 4.8 mmol/L (ref 3.5–5.2)
Sodium: 140 mmol/L (ref 134–144)
Total Protein: 7.4 g/dL (ref 6.0–8.5)
eGFR: 66 mL/min/{1.73_m2} (ref >59)

## 2021-02-02 LAB — VITAMIN B12: Vitamin B-12: 116 pg/mL — ABNORMAL LOW (ref 232–1245)

## 2021-02-02 LAB — TSH: TSH: 1.7 u[IU]/mL (ref 0.450–4.500)

## 2021-02-04 ENCOUNTER — Encounter: Payer: Self-pay | Admitting: Internal Medicine

## 2021-02-04 DIAGNOSIS — E538 Deficiency of other specified B group vitamins: Secondary | ICD-10-CM | POA: Insufficient documentation

## 2021-02-05 ENCOUNTER — Encounter: Payer: Self-pay | Admitting: Internal Medicine

## 2021-03-09 ENCOUNTER — Other Ambulatory Visit: Payer: Self-pay | Admitting: Internal Medicine

## 2021-03-09 NOTE — Telephone Encounter (Signed)
Requested Prescriptions  Pending Prescriptions Disp Refills  . atorvastatin (LIPITOR) 20 MG tablet [Pharmacy Med Name: ATORVASTATIN TAB 20MG ] 90 tablet 3    Sig: TAKE 1 TABLET AT BEDTIME     Cardiovascular:  Antilipid - Statins Failed - 03/09/2021  8:36 AM      Failed - HDL in normal range and within 360 days    HDL  Date Value Ref Range Status  02/01/2021 37 (L) >39 mg/dL Final         Passed - Total Cholesterol in normal range and within 360 days    Cholesterol, Total  Date Value Ref Range Status  02/01/2021 155 100 - 199 mg/dL Final         Passed - LDL in normal range and within 360 days    LDL Chol Calc (NIH)  Date Value Ref Range Status  02/01/2021 95 0 - 99 mg/dL Final         Passed - Triglycerides in normal range and within 360 days    Triglycerides  Date Value Ref Range Status  02/01/2021 128 0 - 149 mg/dL Final         Passed - Patient is not pregnant      Passed - Valid encounter within last 12 months    Recent Outpatient Visits          1 month ago Annual physical exam   [facility], MD   4 months ago Pneumonia due to COVID-19 virus   [facility], MD   1 year ago Annual physical exam   [facility], MD   2 years ago Annual physical exam   [facility], MD   2 years ago Bronchospasm   [facility], MD      Future Appointments            In 11 months Reubin Milan Judithann Graves, MD [facility]

## 2021-03-22 ENCOUNTER — Telehealth: Payer: Self-pay

## 2021-03-22 NOTE — Telephone Encounter (Signed)
Called patient to remind her to complete her FIT test from her recent visit.

## 2021-03-26 DIAGNOSIS — I480 Paroxysmal atrial fibrillation: Secondary | ICD-10-CM | POA: Diagnosis not present

## 2021-03-27 DIAGNOSIS — E119 Type 2 diabetes mellitus without complications: Secondary | ICD-10-CM | POA: Diagnosis not present

## 2021-03-27 DIAGNOSIS — Z794 Long term (current) use of insulin: Secondary | ICD-10-CM | POA: Diagnosis not present

## 2021-03-27 LAB — HEMOGLOBIN A1C: Hemoglobin A1C: 7.9

## 2021-03-29 DIAGNOSIS — Z794 Long term (current) use of insulin: Secondary | ICD-10-CM | POA: Diagnosis not present

## 2021-03-29 DIAGNOSIS — E119 Type 2 diabetes mellitus without complications: Secondary | ICD-10-CM | POA: Diagnosis not present

## 2021-04-07 ENCOUNTER — Other Ambulatory Visit: Payer: Self-pay | Admitting: Internal Medicine

## 2021-04-07 DIAGNOSIS — I428 Other cardiomyopathies: Secondary | ICD-10-CM

## 2021-05-03 ENCOUNTER — Encounter: Payer: Self-pay | Admitting: Internal Medicine

## 2021-05-23 DIAGNOSIS — I480 Paroxysmal atrial fibrillation: Secondary | ICD-10-CM | POA: Diagnosis not present

## 2021-05-23 DIAGNOSIS — I428 Other cardiomyopathies: Secondary | ICD-10-CM | POA: Diagnosis not present

## 2021-05-23 DIAGNOSIS — Z95810 Presence of automatic (implantable) cardiac defibrillator: Secondary | ICD-10-CM | POA: Diagnosis not present

## 2021-05-23 DIAGNOSIS — I5022 Chronic systolic (congestive) heart failure: Secondary | ICD-10-CM | POA: Diagnosis not present

## 2021-05-29 LAB — HM DIABETES EYE EXAM

## 2021-06-07 ENCOUNTER — Other Ambulatory Visit: Payer: BC Managed Care – PPO

## 2021-07-03 DIAGNOSIS — E119 Type 2 diabetes mellitus without complications: Secondary | ICD-10-CM | POA: Diagnosis not present

## 2021-07-03 DIAGNOSIS — I5022 Chronic systolic (congestive) heart failure: Secondary | ICD-10-CM | POA: Diagnosis not present

## 2021-07-03 DIAGNOSIS — Z794 Long term (current) use of insulin: Secondary | ICD-10-CM | POA: Diagnosis not present

## 2021-07-03 LAB — MICROALBUMIN / CREATININE URINE RATIO: Microalb Creat Ratio: 12.1

## 2021-07-07 ENCOUNTER — Other Ambulatory Visit: Payer: Self-pay | Admitting: Internal Medicine

## 2021-07-07 DIAGNOSIS — I428 Other cardiomyopathies: Secondary | ICD-10-CM

## 2021-07-07 NOTE — Telephone Encounter (Signed)
Requested medication (s) are due for refill today: yes  Requested medication (s) are on the active medication list: yes  Last refill:  04/08/21 #90  Future visit scheduled: yes  Notes to clinic:  needs mammogram   Requested Prescriptions  Pending Prescriptions Disp Refills   estradiol (ESTRACE) 0.5 MG tablet 90 tablet 0    Sig: Take 1 tablet (0.5 mg total) by mouth daily.     OB/GYN:  Estrogens Failed - 07/07/2021  9:24 AM      Failed - Mammogram is up-to-date per Health Maintenance      Passed - Last BP in normal range    BP Readings from Last 1 Encounters:  02/01/21 134/68          Passed - Valid encounter within last 12 months    Recent Outpatient Visits           5 months ago Annual physical exam   [facility] Reubin Milan, MD   8 months ago Pneumonia due to COVID-19 virus   [facility] Reubin Milan, MD   1 year ago Annual physical exam   [facility] Reubin Milan, MD   2 years ago Annual physical exam   [facility] Reubin Milan, MD   2 years ago Bronchospasm   [facility] Reubin Milan, MD       Future Appointments             In 7 months Judithann Graves Nyoka Cowden, MD [facility]

## 2021-07-07 NOTE — Telephone Encounter (Signed)
Requested medication (s) are due for refill today: yes  Requested medication (s) are on the active medication list: yes  Last refill:  04/08/21 #90  Future visit scheduled: yes  Notes to clinic:  needs mammogram   Requested Prescriptions  Pending Prescriptions Disp Refills   estradiol (ESTRACE) 0.5 MG tablet 90 tablet 0    Sig: Take 1 tablet (0.5 mg total) by mouth daily.     OB/GYN:  Estrogens Failed - 07/07/2021  9:42 AM      Failed - Mammogram is up-to-date per Health Maintenance      Passed - Last BP in normal range    BP Readings from Last 1 Encounters:  02/01/21 134/68          Passed - Valid encounter within last 12 months    Recent Outpatient Visits           5 months ago Annual physical exam   [facility] Reubin Milan, MD   8 months ago Pneumonia due to COVID-19 virus   [facility] Reubin Milan, MD   1 year ago Annual physical exam   [facility] Reubin Milan, MD   2 years ago Annual physical exam   [facility] Reubin Milan, MD   2 years ago Bronchospasm   [facility] Reubin Milan, MD       Future Appointments             In 7 months Judithann Graves Nyoka Cowden, MD [facility]

## 2021-07-12 DIAGNOSIS — Z794 Long term (current) use of insulin: Secondary | ICD-10-CM | POA: Diagnosis not present

## 2021-07-12 DIAGNOSIS — E119 Type 2 diabetes mellitus without complications: Secondary | ICD-10-CM | POA: Diagnosis not present

## 2021-07-22 ENCOUNTER — Other Ambulatory Visit: Payer: Self-pay | Admitting: Internal Medicine

## 2021-07-22 ENCOUNTER — Encounter: Payer: Self-pay | Admitting: Internal Medicine

## 2021-07-22 DIAGNOSIS — I428 Other cardiomyopathies: Secondary | ICD-10-CM

## 2021-07-22 DIAGNOSIS — Z780 Asymptomatic menopausal state: Secondary | ICD-10-CM

## 2021-07-22 MED ORDER — ESTRADIOL 0.5 MG PO TABS: 0.5000 mg | ORAL_TABLET | Freq: Every day | ORAL | 1 refills | Status: DC

## 2021-07-22 MED ORDER — MEDROXYPROGESTERONE ACETATE 2.5 MG PO TABS: 2.5000 mg | ORAL_TABLET | Freq: Every day | ORAL | 1 refills | Status: DC

## 2021-07-24 ENCOUNTER — Ambulatory Visit (INDEPENDENT_AMBULATORY_CARE_PROVIDER_SITE_OTHER): Payer: BC Managed Care – PPO | Admitting: Internal Medicine

## 2021-07-24 ENCOUNTER — Encounter: Payer: Self-pay | Admitting: Internal Medicine

## 2021-07-24 VITALS — Temp 97.1°F | Ht 62.0 in | Wt 162.0 lb

## 2021-07-24 DIAGNOSIS — U071 COVID-19: Secondary | ICD-10-CM

## 2021-07-24 MED ORDER — MOLNUPIRAVIR EUA 200MG CAPSULE: 4.0000 | ORAL_CAPSULE | Freq: Two times a day (BID) | ORAL | 0 refills | Status: AC

## 2021-07-24 NOTE — Progress Notes (Signed)
Date:  07/24/2021   Name:  DAZANI NORBY   DOB:  [birth year]   MRN:  [id number]  This encounter was conducted via video encounter due to the need for social distancing in light of the Covid-19 pandemic.  The patient was correctly identified.  I advised that I am conducting the visit from a secure room in my office at [facility] clinic.  The patient is located home. The limitations of this form of encounter were discussed with the patient and he/she agreed to proceed.  Some vital signs will be absent.  Chief Complaint: Covid Positive  Cough This is a new problem. The current episode started today. The problem has been gradually worsening. The problem occurs constantly. The cough is Productive of sputum. Associated symptoms include headaches, nasal congestion and a sore throat. Pertinent negatives include no chest pain, chills, ear pain, fever, myalgias, shortness of breath, sweats or wheezing. [place] and Husband tested Positive Saturday and patient tested positive today with a home test.  Immunization History  Administered Date(s) Administered   Influenza-Unspecified 06/13/2016, 07/09/2020   PFIZER(Purple Top)SARS-COV-2 Vaccination 11/29/2019, 12/20/2019, 08/11/2020   Pneumococcal Polysaccharide-23 12/10/2017    Lab Results  Component Value Date   CREATININE 0.99 02/01/2021   BUN 12 02/01/2021   NA 140 02/01/2021   K 4.8 02/01/2021   CL 100 02/01/2021   CO2 22 02/01/2021   Lab Results  Component Value Date   CHOL 155 02/01/2021   HDL 37 (L) 02/01/2021   LDLCALC 95 02/01/2021   TRIG 128 02/01/2021   CHOLHDL 4.2 02/01/2021   Lab Results  Component Value Date   TSH 1.700 02/01/2021   Lab Results  Component Value Date   HGBA1C 7.9 03/27/2021   Lab Results  Component Value Date   WBC 9.5 02/01/2021   HGB 13.5 02/01/2021   HCT 39.6 02/01/2021   MCV 98 (H) 02/01/2021   PLT 327 02/01/2021   Lab Results  Component Value Date   ALT 10  02/01/2021   AST 12 02/01/2021   ALKPHOS 152 (H) 02/01/2021   BILITOT 0.6 02/01/2021     Review of Systems  Constitutional:  Negative for chills and fever.  HENT:  Positive for congestion, sinus pressure and sore throat. Negative for ear pain and trouble swallowing.   Respiratory:  Positive for cough. Negative for shortness of breath and wheezing.   Cardiovascular:  Negative for chest pain.  Gastrointestinal:  Negative for diarrhea, nausea and vomiting.  Musculoskeletal:  Negative for myalgias.  Neurological:  Positive for headaches.   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   Non-ischemic cardiomyopathy (HCC) 07/14/2015   Small airways disease 06/19/2015   Menopause 05/15/2015   Anxiety, mild 03/30/2015   Type II diabetes mellitus with complication (HCC) 03/30/2015   Essential (primary) hypertension 03/30/2015   Hyperlipidemia associated with type 2 diabetes mellitus (HCC) 03/30/2015    Allergies  Allergen Reactions   Januvia [Sitagliptin] Nausea Only    Past Surgical History:  Procedure Laterality Date   CARDIAC CATHETERIZATION N/A 07/12/2015   Procedure: Right and Left Heart Cath;  Surgeon: Alwyn Pea, MD;  Location: [facility] INVASIVE CV LAB;  Service: Cardiovascular;  Laterality: N/A;   CARDIAC DEFIBRILLATOR PLACEMENT  01/2016  CESAREAN SECTION     GALLBLADDER SURGERY      Social History   Tobacco Use   Smoking status: Former   Smokeless tobacco: Never  Substance Use Topics   Alcohol use: Yes    Alcohol/week: 12.0 standard drinks    Types: 12 Cans of beer per week    Comment: occasional   Drug use: No     Medication list has been reviewed and updated.  Current Meds  Medication Sig   atorvastatin  (LIPITOR) 20 MG tablet TAKE 1 TABLET AT BEDTIME   B-D ULTRAFINE III SHORT PEN 31G X 8 MM MISC    carvedilol (COREG) 3.125 MG tablet Take 1 tablet by mouth 2 (two) times daily.   ENTRESTO 24-26 MG Take 1 tablet by mouth 2 (two) times daily.   estradiol (ESTRACE) 0.5 MG tablet Take 1 tablet (0.5 mg total) by mouth daily.   furosemide (LASIX) 40 MG tablet Take 40 mg by mouth daily.   glucose blood test strip Use as instructed   insulin degludec (TRESIBA) 100 UNIT/ML FlexTouch Pen Inject 20 Units into the skin daily at 10 pm.   JANUVIA 100 MG tablet Take 100 mg by mouth daily.   lisinopril (PRINIVIL,ZESTRIL) 40 MG tablet Take 1 tablet by mouth daily.   medroxyPROGESTERone (PROVERA) 2.5 MG tablet Take 1 tablet (2.5 mg total) by mouth daily.   ONETOUCH DELICA LANCETS 33G MISC     PHQ 2/9 Scores 07/24/2021 02/01/2021 10/11/2020 01/31/2020  PHQ - 2 Score 0 0 0 0  PHQ- 9 Score 0 2 0 0    GAD 7 : Generalized Anxiety Score 07/24/2021 02/01/2021 10/11/2020 01/31/2020  Nervous, Anxious, on Edge 0 1 0 0  Control/stop worrying 0 0 0 0  Worry too much - different things 0 0 0 0  Trouble relaxing 0 1 0 0  Restless 0 1 0 0  Easily annoyed or irritable 0 0 0 0  Afraid - awful might happen 0 1 0 0  Total GAD 7 Score 0 4 0 0  Anxiety Difficulty Not difficult at all - - Not difficult at all    BP Readings from Last 3 Encounters:  02/01/21 134/68  10/11/20 122/68  01/31/20 130/70    Physical Exam Constitutional:      Appearance: Normal appearance. She is not ill-appearing.  Pulmonary:     Effort: Pulmonary effort is normal.     Comments: No cough or dyspnea noted during the call Neurological:     Mental Status: She is alert.  Psychiatric:        Attention and Perception: Attention normal.        Mood and Affect: Mood normal.        Speech: Speech normal.        Cognition and Memory: Cognition normal.    Wt Readings from Last 3 Encounters:  07/24/21 162 lb (73.5 kg)  02/01/21 162 lb (73.5 kg)   10/11/20 160 lb (72.6 kg)    Temp (!) 97.1 F (36.2 C) (Oral)   Ht 5\' 2"  (1.575 m)   Wt 162 lb (73.5 kg)   BMI 29.63 kg/m   Assessment and Plan: 1. COVID-19 At risk for complications due to DM and CAD so will prescribe anti-viral meds Continue tylenol and fluids, rest Quarantine for 5 days. Precautions for worsening symptoms that would necessitate in-person evaluation given - molnupiravir EUA (LAGEVRIO) 200 mg CAPS capsule; Take 4 capsules (800 mg total) by mouth 2 (two) times daily  for 5 days.  Dispense: 40 capsule; Refill: 0  I spent 6 minutes on this encounter. Partially dictated using Animal nutritionist. Any errors are unintentional.  Bari Edward, MD [facility]  07/24/2021

## 2021-07-26 ENCOUNTER — Encounter: Payer: Self-pay | Admitting: Internal Medicine

## 2021-07-26 ENCOUNTER — Ambulatory Visit: Payer: BC Managed Care – PPO | Admitting: Internal Medicine

## 2021-07-26 ENCOUNTER — Other Ambulatory Visit: Payer: Self-pay | Admitting: Internal Medicine

## 2021-07-26 DIAGNOSIS — U071 COVID-19: Secondary | ICD-10-CM

## 2021-07-26 MED ORDER — GUAIFENESIN-CODEINE 100-10 MG/5ML PO SYRP: 5.0000 mL | ORAL_SOLUTION | Freq: Three times a day (TID) | ORAL | 0 refills | Status: DC | PRN

## 2021-09-23 DIAGNOSIS — T82111A Breakdown (mechanical) of cardiac pulse generator (battery), initial encounter: Secondary | ICD-10-CM | POA: Diagnosis not present

## 2021-09-23 DIAGNOSIS — Z45010 Encounter for checking and testing of cardiac pacemaker pulse generator [battery]: Secondary | ICD-10-CM | POA: Diagnosis not present

## 2021-09-23 DIAGNOSIS — I5022 Chronic systolic (congestive) heart failure: Secondary | ICD-10-CM | POA: Diagnosis not present

## 2021-10-15 DIAGNOSIS — I429 Cardiomyopathy, unspecified: Secondary | ICD-10-CM | POA: Diagnosis not present

## 2021-10-15 DIAGNOSIS — I428 Other cardiomyopathies: Secondary | ICD-10-CM | POA: Diagnosis not present

## 2021-11-04 DIAGNOSIS — E119 Type 2 diabetes mellitus without complications: Secondary | ICD-10-CM | POA: Diagnosis not present

## 2021-11-04 DIAGNOSIS — Z794 Long term (current) use of insulin: Secondary | ICD-10-CM | POA: Diagnosis not present

## 2021-11-04 LAB — HEMOGLOBIN A1C: Hemoglobin A1C: 6.8

## 2021-11-11 DIAGNOSIS — E119 Type 2 diabetes mellitus without complications: Secondary | ICD-10-CM | POA: Diagnosis not present

## 2021-11-11 DIAGNOSIS — Z794 Long term (current) use of insulin: Secondary | ICD-10-CM | POA: Diagnosis not present

## 2021-11-21 DIAGNOSIS — I480 Paroxysmal atrial fibrillation: Secondary | ICD-10-CM | POA: Diagnosis not present

## 2021-11-21 DIAGNOSIS — I5020 Unspecified systolic (congestive) heart failure: Secondary | ICD-10-CM | POA: Diagnosis not present

## 2021-11-21 DIAGNOSIS — I428 Other cardiomyopathies: Secondary | ICD-10-CM | POA: Diagnosis not present

## 2021-11-21 DIAGNOSIS — Z95810 Presence of automatic (implantable) cardiac defibrillator: Secondary | ICD-10-CM | POA: Diagnosis not present

## 2021-11-29 ENCOUNTER — Encounter: Payer: Self-pay | Admitting: Internal Medicine

## 2021-12-02 DIAGNOSIS — I5020 Unspecified systolic (congestive) heart failure: Secondary | ICD-10-CM | POA: Diagnosis not present

## 2021-12-24 DIAGNOSIS — I5020 Unspecified systolic (congestive) heart failure: Secondary | ICD-10-CM | POA: Diagnosis not present

## 2021-12-24 DIAGNOSIS — Z95810 Presence of automatic (implantable) cardiac defibrillator: Secondary | ICD-10-CM | POA: Diagnosis not present

## 2021-12-24 DIAGNOSIS — I428 Other cardiomyopathies: Secondary | ICD-10-CM | POA: Diagnosis not present

## 2021-12-24 DIAGNOSIS — I480 Paroxysmal atrial fibrillation: Secondary | ICD-10-CM | POA: Diagnosis not present

## 2022-01-01 DIAGNOSIS — I5020 Unspecified systolic (congestive) heart failure: Secondary | ICD-10-CM | POA: Insufficient documentation

## 2022-01-06 DIAGNOSIS — E113293 Type 2 diabetes mellitus with mild nonproliferative diabetic retinopathy without macular edema, bilateral: Secondary | ICD-10-CM | POA: Diagnosis not present

## 2022-01-06 DIAGNOSIS — Z794 Long term (current) use of insulin: Secondary | ICD-10-CM | POA: Diagnosis not present

## 2022-01-06 LAB — HM DIABETES EYE EXAM

## 2022-01-14 DIAGNOSIS — Z95810 Presence of automatic (implantable) cardiac defibrillator: Secondary | ICD-10-CM | POA: Diagnosis not present

## 2022-01-16 ENCOUNTER — Encounter: Payer: Self-pay | Admitting: Internal Medicine

## 2022-01-17 ENCOUNTER — Ambulatory Visit: Payer: BC Managed Care – PPO | Admitting: Internal Medicine

## 2022-01-17 ENCOUNTER — Encounter: Payer: Self-pay | Admitting: Internal Medicine

## 2022-01-17 VITALS — BP 124/70 | HR 73 | Ht 62.0 in | Wt 162.0 lb

## 2022-01-17 DIAGNOSIS — L03312 Cellulitis of back [any part except buttock]: Secondary | ICD-10-CM | POA: Diagnosis not present

## 2022-01-17 DIAGNOSIS — Z1211 Encounter for screening for malignant neoplasm of colon: Secondary | ICD-10-CM

## 2022-01-17 DIAGNOSIS — E118 Type 2 diabetes mellitus with unspecified complications: Secondary | ICD-10-CM | POA: Diagnosis not present

## 2022-01-17 MED ORDER — TRIAMCINOLONE ACETONIDE 0.5 % EX CREA: 1.0000 "application " | TOPICAL_CREAM | Freq: Three times a day (TID) | CUTANEOUS | 0 refills | Status: DC

## 2022-01-17 MED ORDER — DOXYCYCLINE HYCLATE 100 MG PO TABS: 100.0000 mg | ORAL_TABLET | Freq: Two times a day (BID) | ORAL | 0 refills | Status: AC

## 2022-01-17 NOTE — Progress Notes (Signed)
? ? ?Date:  01/17/2022  ? ?Name:  Lori Brewer   DOB:  19-Dec-1960   MRN:  [id number] ? ? ?Chief Complaint: Rash (Spot on back- Itching and Red) ? ?Rash ?This is a new problem. The current episode started in the past 7 days. The affected locations include the back (Upper Right Shoulder Blade). The rash is characterized by pain, redness and itchiness (Perfect Circle with a small opening in the middle). Pertinent negatives include no cough, diarrhea, fatigue or shortness of breath. Patient unsure if she has been bitten by a bug in the area.  ? ?Lab Results  ?Component Value Date  ? NA 140 02/01/2021  ? K 4.8 02/01/2021  ? CO2 22 02/01/2021  ? GLUCOSE 147 (H) 02/01/2021  ? BUN 12 02/01/2021  ? CREATININE 0.99 02/01/2021  ? CALCIUM 9.4 02/01/2021  ? EGFR 66 02/01/2021  ? GFRNONAA 79 01/31/2020  ? ?Lab Results  ?Component Value Date  ? CHOL 155 02/01/2021  ? HDL 37 (L) 02/01/2021  ? [place] 95 02/01/2021  ? TRIG 128 02/01/2021  ? CHOLHDL 4.2 02/01/2021  ? ?Lab Results  ?Component Value Date  ? TSH 1.700 02/01/2021  ? ?Lab Results  ?Component Value Date  ? HGBA1C 7.9 03/27/2021  ? ?Lab Results  ?Component Value Date  ? WBC 9.5 02/01/2021  ? HGB 13.5 02/01/2021  ? HCT 39.6 02/01/2021  ? MCV 98 (H) 02/01/2021  ? PLT 327 02/01/2021  ? ?Lab Results  ?Component Value Date  ? ALT 10 02/01/2021  ? AST 12 02/01/2021  ? ALKPHOS 152 (H) 02/01/2021  ? BILITOT 0.6 02/01/2021  ? ?No results found for: 25OHVITD2, [place], VD25OH  ? ?Review of Systems  ?Constitutional:  Negative for fatigue and unexpected weight change.  ?HENT:  Negative for nosebleeds.   ?Eyes:  Negative for visual disturbance.  ?Respiratory:  Negative for cough, chest tightness, shortness of breath and wheezing.   ?Cardiovascular:  Negative for chest pain, palpitations and leg swelling.  ?Gastrointestinal:  Negative for abdominal pain, constipation and diarrhea.  ?Musculoskeletal:  Negative for arthralgias, gait problem and joint swelling.  ?Skin:  Positive for color  change and rash.  ?Neurological:  Negative for dizziness, weakness, light-headedness and headaches.  ?Psychiatric/Behavioral:  Negative for dysphoric mood and sleep disturbance. The patient is not nervous/anxious.   ? ?Patient Active Problem List  ? Diagnosis Date Noted  ? B12 nutritional deficiency 02/04/2021  ? Generalized anxiety disorder 05/15/2017  ? Environmental and seasonal allergies 05/15/2017  ? Elevated WBC count 07/03/2016  ? 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  ? Non-ischemic cardiomyopathy ([place]) 07/14/2015  ? Small airways disease 06/19/2015  ? Menopause 05/15/2015  ? Anxiety, mild 03/30/2015  ? Type II diabetes mellitus with complication ([place]) 48/09/6551  ? Essential (primary) hypertension 03/30/2015  ? Hyperlipidemia associated with type 2 diabetes mellitus ([place]) 03/30/2015  ? ? ?Allergies  ?Allergen Reactions  ? Januvia [Sitagliptin] Nausea Only  ? ? ?Past Surgical History:  ?Procedure Laterality Date  ? CARDIAC CATHETERIZATION N/A 07/12/2015  ? Procedure: Right and Left Heart Cath;  Surgeon: Yolonda Kida, MD;  Location: [facility];  Service: Cardiovascular;  Laterality: N/A;  ? CARDIAC DEFIBRILLATOR PLACEMENT  01/2016  ? CESAREAN SECTION    ? GALLBLADDER SURGERY    ? ? ?Social History  ? ?Tobacco Use  ? Smoking status: Former  ? Smokeless tobacco: Never  ?Substance Use Topics  ? Alcohol use: Yes  ?  Alcohol/week: 12.0 standard drinks  ?  Types: 12 Cans of beer per week  ?  Comment: occasional  ? Drug use: No  ? ? ? ?Medication list has been reviewed and updated. ? ?Current Meds  ?Medication Sig  ? atorvastatin (LIPITOR) 20 MG tablet TAKE 1 TABLET AT BEDTIME  ? B-D ULTRAFINE III SHORT PEN 31G X 8 MM MISC   ? carvedilol (COREG) 3.125 MG tablet Take 1 tablet by mouth 2 (two) times daily.  ? doxycycline (VIBRA-TABS) 100 MG tablet Take 1 tablet (100 mg total) by mouth 2 (two) times daily for 10 days.  ? ENTRESTO 24-26 MG Take 1 tablet by mouth 2  (two) times daily.  ? estradiol (ESTRACE) 0.5 MG tablet Take 1 tablet (0.5 mg total) by mouth daily.  ? FARXIGA 10 MG TABS tablet Take 10 mg by mouth daily.  ? furosemide (LASIX) 40 MG tablet Take 40 mg by mouth daily.  ? glucose blood test strip Use as instructed  ? insulin degludec (TRESIBA) 100 UNIT/ML FlexTouch Pen Inject 20 Units into the skin daily at 10 pm.  ? JANUVIA 100 MG tablet Take 100 mg by mouth daily.  ? lisinopril (PRINIVIL,ZESTRIL) 40 MG tablet Take 1 tablet by mouth daily.  ? medroxyPROGESTERone (PROVERA) 2.5 MG tablet Take 1 tablet (2.5 mg total) by mouth daily.  ? ONETOUCH DELICA LANCETS 79X MISC   ? triamcinolone cream (KENALOG) 0.5 % Apply 1 application. topically 3 (three) times daily. To rash on back  ? ? ? ?  07/24/2021  ? 11:35 AM 02/01/2021  ?  8:32 AM 10/11/2020  ?  1:18 PM 01/31/2020  ?  8:23 AM  ?GAD 7 : Generalized Anxiety Score  ?Nervous, Anxious, on Edge 0 1 0 0  ?Control/stop worrying 0 0 0 0  ?Worry too much - different things 0 0 0 0  ?Trouble relaxing 0 1 0 0  ?Restless 0 1 0 0  ?Easily annoyed or irritable 0 0 0 0  ?Afraid - awful might happen 0 1 0 0  ?Total GAD 7 Score 0 4 0 0  ?Anxiety Difficulty Not difficult at all   Not difficult at all  ? ? ? ?  07/24/2021  ? 11:35 AM  ?Depression screen PHQ 2/9  ?Decreased Interest 0  ?Down, Depressed, Hopeless 0  ?PHQ - 2 Score 0  ?Altered sleeping 0  ?Tired, decreased energy 0  ?Change in appetite 0  ?Feeling bad or failure about yourself  0  ?Trouble concentrating 0  ?Moving slowly or fidgety/restless 0  ?Suicidal thoughts 0  ?PHQ-9 Score 0  ?Difficult doing work/chores Not difficult at all  ? ? ?BP Readings from Last 3 Encounters:  ?01/17/22 124/70  ?02/01/21 134/68  ?10/11/20 122/68  ? ? ?Physical Exam ?Vitals and nursing note reviewed.  ?Constitutional:   ?   General: She is not in acute distress. ?   Appearance: She is well-developed.  ?HENT:  ?   Head: Normocephalic and atraumatic.  ?Pulmonary:  ?   Effort: Pulmonary effort is  normal. No respiratory distress.  ?Skin: ?   General: Skin is warm and dry.  ?   Findings: No rash.  ? ?    ?   Comments: Pink lesion with central tiny ulcer/bite site and fine papular rash.  No drainage or vesicles.  ?Neurological:  ?   Mental Status: She is alert and oriented to person, place, and time.  ?Psychiatric:     ?   Mood and Affect: Mood normal.     ?  Behavior: Behavior normal.  ? ? ?Wt Readings from Last 3 Encounters:  ?01/17/22 162 lb (73.5 kg)  ?07/24/21 162 lb (73.5 kg)  ?02/01/21 162 lb (73.5 kg)  ? ? ?BP 124/70   Pulse 73   Ht $R'5\' 2"'eA$  (1.575 m)   Wt 162 lb (73.5 kg)   SpO2 99%   BMI 29.63 kg/m?  ? ?Assessment and Plan: ?1. Cellulitis of back except buttock ?Appears to be from either an insect bite or infected papule.  Now with surrounding erythema and rash.  Not a target lesion by exam. ?Will treat with antibiotics for cellulitis which will also cover tick borne disease. ?Topical steroids for comfort. ?- doxycycline (VIBRA-TABS) 100 MG tablet; Take 1 tablet (100 mg total) by mouth 2 (two) times daily for 10 days.  Dispense: 20 tablet; Refill: 0 ?- triamcinolone cream (KENALOG) 0.5 %; Apply 1 application. topically 3 (three) times daily. To rash on back  Dispense: 30 g; Refill: 0 ? ?2. Colon cancer screening ?Recommend she obtain the sample just before her CPX next month so that the kit can be delivered in person to [facility] ?- Fecal occult blood, imunochemical ? ?3. Type II diabetes mellitus with complication (HCC) ?Doing very well with excellent control ?Continues under the care of Dr. Gabriel Carina. ?Eye exam was completed recently - we will request the note. ? ? ?Partially dictated using Editor, commissioning. Any errors are unintentional. ? ?Halina Maidens, MD ?[facility] ?[facility] ? ?01/17/2022 ? ? ? ? ? ?

## 2022-01-21 ENCOUNTER — Encounter: Payer: Self-pay | Admitting: Internal Medicine

## 2022-02-10 ENCOUNTER — Ambulatory Visit (INDEPENDENT_AMBULATORY_CARE_PROVIDER_SITE_OTHER): Payer: BC Managed Care – PPO | Admitting: Internal Medicine

## 2022-02-10 ENCOUNTER — Encounter: Payer: Self-pay | Admitting: Internal Medicine

## 2022-02-10 VITALS — BP 128/64 | HR 77 | Ht 62.0 in | Wt 167.0 lb

## 2022-02-10 DIAGNOSIS — E1169 Type 2 diabetes mellitus with other specified complication: Secondary | ICD-10-CM | POA: Diagnosis not present

## 2022-02-10 DIAGNOSIS — Z0000 Encounter for general adult medical examination without abnormal findings: Secondary | ICD-10-CM

## 2022-02-10 DIAGNOSIS — Z1231 Encounter for screening mammogram for malignant neoplasm of breast: Secondary | ICD-10-CM | POA: Diagnosis not present

## 2022-02-10 DIAGNOSIS — E538 Deficiency of other specified B group vitamins: Secondary | ICD-10-CM

## 2022-02-10 DIAGNOSIS — I428 Other cardiomyopathies: Secondary | ICD-10-CM

## 2022-02-10 DIAGNOSIS — Z1211 Encounter for screening for malignant neoplasm of colon: Secondary | ICD-10-CM | POA: Diagnosis not present

## 2022-02-10 DIAGNOSIS — E118 Type 2 diabetes mellitus with unspecified complications: Secondary | ICD-10-CM | POA: Diagnosis not present

## 2022-02-10 DIAGNOSIS — I10 Essential (primary) hypertension: Secondary | ICD-10-CM

## 2022-02-10 DIAGNOSIS — E785 Hyperlipidemia, unspecified: Secondary | ICD-10-CM

## 2022-02-10 LAB — POCT URINALYSIS DIPSTICK
Bilirubin, UA: NEGATIVE
Blood, UA: NEGATIVE
Glucose, UA: POSITIVE — AB
Ketones, UA: NEGATIVE
Leukocytes, UA: NEGATIVE
Nitrite, UA: NEGATIVE
Protein, UA: NEGATIVE
Spec Grav, UA: <=1.005 — AB (ref 1.010–1.025)
Urobilinogen, UA: 0.2 E.U./dL
pH, UA: 6 (ref 5.0–8.0)

## 2022-02-10 NOTE — Progress Notes (Signed)
Date:  02/10/2022   Name:  Lori Brewer   DOB:  18-Mar-1961   MRN:  [id number]   Chief Complaint: Annual Exam (Breast exam no pap ) Lori Brewer is a 61 y.o. female who presents today for her Complete Annual Exam. She feels well. She reports exercising walking daily. She reports she is sleeping well. Breast complaints none.  Mammogram: 2016 DEXA: none Pap smear: 01/2019 neg with co-testing Colonoscopy: FIT sent in  Health Maintenance Due  Topic Date Due   COLONOSCOPY (Pts 45-10yrs Insurance coverage will need to be confirmed)  Never done   MAMMOGRAM  05/23/2016   COLON CANCER SCREENING ANNUAL FOBT  02/11/2020   COVID-19 Vaccine (4 - Booster for [place] series) 10/06/2020   FOOT EXAM  02/01/2022    Immunization History  Administered Date(s) Administered   Influenza-Unspecified 06/13/2016, 07/09/2020   PFIZER(Purple Top)SARS-COV-2 Vaccination 11/29/2019, 12/20/2019, 08/11/2020   Pneumococcal Polysaccharide-23 12/10/2017    Hypertension This is a chronic problem. The problem is controlled. Pertinent negatives include no chest pain, headaches, palpitations or shortness of breath. Past treatments include diuretics, ACE inhibitors, angiotensin blockers and beta blockers (on Enstresto for HF).  Diabetes She presents for her follow-up (followed by Endo) diabetic visit. She has type 2 diabetes mellitus. Her disease course has been stable. Pertinent negatives for hypoglycemia include no dizziness, headaches, nervousness/anxiousness or tremors. Pertinent negatives for diabetes include no chest pain, no fatigue, no polydipsia and no polyuria. Current diabetic treatment includes oral agent (triple therapy) (farxiga, Januvia, Tresiba). She is compliant with treatment all of the time.  Hyperlipidemia This is a chronic problem. The problem is controlled. Pertinent negatives include no chest pain or shortness of breath. Current antihyperlipidemic treatment includes statins. The current treatment  provides significant improvement of lipids.   Lab Results  Component Value Date   NA 140 02/01/2021   K 4.8 02/01/2021   CO2 22 02/01/2021   GLUCOSE 147 (H) 02/01/2021   BUN 12 02/01/2021   CREATININE 0.99 02/01/2021   CALCIUM 9.4 02/01/2021   EGFR 66 02/01/2021   GFRNONAA 79 01/31/2020   Lab Results  Component Value Date   CHOL 155 02/01/2021   HDL 37 (L) 02/01/2021   LDLCALC 95 02/01/2021   TRIG 128 02/01/2021   CHOLHDL 4.2 02/01/2021   Lab Results  Component Value Date   TSH 1.700 02/01/2021   Lab Results  Component Value Date   HGBA1C 6.8 11/04/2021   Lab Results  Component Value Date   WBC 9.5 02/01/2021   HGB 13.5 02/01/2021   HCT 39.6 02/01/2021   MCV 98 (H) 02/01/2021   PLT 327 02/01/2021   Lab Results  Component Value Date   ALT 10 02/01/2021   AST 12 02/01/2021   ALKPHOS 152 (H) 02/01/2021   BILITOT 0.6 02/01/2021   No results found for: 25OHVITD2, 25OHVITD3, VD25OH   Review of Systems  Constitutional:  Negative for chills, fatigue and fever.  HENT:  Negative for congestion, hearing loss, tinnitus, trouble swallowing and voice change.   Eyes:  Negative for visual disturbance.  Respiratory:  Negative for cough, chest tightness, shortness of breath and wheezing.   Cardiovascular:  Negative for chest pain, palpitations and leg swelling.  Gastrointestinal:  Negative for abdominal pain, constipation, diarrhea and vomiting.  Endocrine: Negative for polydipsia and polyuria.  Genitourinary:  Negative for dysuria, frequency, genital sores, vaginal bleeding and vaginal discharge.  Musculoskeletal:  Negative for arthralgias, gait problem and joint swelling.  Skin:  Negative for color change and rash.  Neurological:  Negative for dizziness, tremors, light-headedness and headaches.  Hematological:  Negative for adenopathy. Does not bruise/bleed easily.  Psychiatric/Behavioral:  Negative for dysphoric mood and sleep disturbance. The patient is not  nervous/anxious.    Patient Active Problem List   Diagnosis Date Noted   HFrEF (heart failure with reduced ejection fraction) ([place]) 01/01/2022   B12 nutritional deficiency 02/04/2021   Generalized anxiety disorder 05/15/2017   Environmental and seasonal allergies 05/15/2017   Elevated WBC count 07/03/2016   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   Non-ischemic cardiomyopathy ([place]) 07/14/2015   Small airways disease 06/19/2015   Menopause 05/15/2015   Anxiety, mild 03/30/2015   Type II diabetes mellitus with complication ([place]) 79/10/4095   Essential (primary) hypertension 03/30/2015   Hyperlipidemia associated with type 2 diabetes mellitus ([place]) 03/30/2015    Allergies  Allergen Reactions   Januvia [Sitagliptin] Nausea Only    Past Surgical History:  Procedure Laterality Date   CARDIAC CATHETERIZATION N/A 07/12/2015   Procedure: Right and Left Heart Cath;  Surgeon: Yolonda Kida, MD;  Location: [facility];  Service: Cardiovascular;  Laterality: N/A;   CARDIAC DEFIBRILLATOR PLACEMENT  01/2016   CESAREAN SECTION     GALLBLADDER SURGERY      Social History   Tobacco Use   Smoking status: Former   Smokeless tobacco: Never  Substance Use Topics   Alcohol use: Yes    Alcohol/week: 12.0 standard drinks    Types: 12 Cans of beer per week    Comment: occasional   Drug use: No     Medication list has been reviewed and updated.  Current Meds  Medication Sig   atorvastatin (LIPITOR) 20 MG tablet TAKE 1 TABLET AT BEDTIME   B-D ULTRAFINE III SHORT PEN 31G X 8 MM MISC    carvedilol (COREG) 3.125 MG tablet Take 1 tablet by mouth 2 (two) times daily.   ENTRESTO 24-26 MG Take 1 tablet by mouth 2 (two) times daily.   estradiol (ESTRACE) 0.5 MG tablet Take 1 tablet (0.5 mg total) by mouth daily.   FARXIGA 10 MG TABS tablet Take 10 mg by mouth daily.   furosemide (LASIX) 40 MG tablet Take 40 mg by mouth daily.   glucose blood  test strip Use as instructed   insulin degludec (TRESIBA) 100 UNIT/ML FlexTouch Pen Inject 20 Units into the skin daily at 10 pm.   lisinopril (PRINIVIL,ZESTRIL) 40 MG tablet Take 1 tablet by mouth daily.   medroxyPROGESTERone (PROVERA) 2.5 MG tablet Take 1 tablet (2.5 mg total) by mouth daily.   ONETOUCH DELICA LANCETS 35H MISC    triamcinolone cream (KENALOG) 0.5 % Apply 1 application. topically 3 (three) times daily. To rash on back       02/10/2022    8:00 AM 07/24/2021   11:35 AM 02/01/2021    8:32 AM 10/11/2020    1:18 PM  GAD 7 : Generalized Anxiety Score  Nervous, Anxious, on Edge 0 0 1 0  Control/stop worrying 0 0 0 0  Worry too much - different things 1 0 0 0  Trouble relaxing 1 0 1 0  Restless 0 0 1 0  Easily annoyed or irritable 0 0 0 0  Afraid - awful might happen 0 0 1 0  Total GAD 7 Score 2 0 4 0  Anxiety Difficulty  Not difficult at all         02/10/2022    8:00  AM  Depression screen PHQ 2/9  Decreased Interest 0  Down, Depressed, Hopeless 0  PHQ - 2 Score 0  Altered sleeping 1  Tired, decreased energy 1  Change in appetite 0  Feeling bad or failure about yourself  0  Trouble concentrating 0  Moving slowly or fidgety/restless 0  Suicidal thoughts 0  PHQ-9 Score 2  Difficult doing work/chores Not difficult at all    BP Readings from Last 3 Encounters:  02/10/22 128/64  01/17/22 124/70  02/01/21 134/68    Physical Exam Vitals and nursing note reviewed.  Constitutional:      General: She is not in acute distress.    Appearance: She is well-developed.  HENT:     Head: Normocephalic and atraumatic.     Right Ear: Tympanic membrane and ear canal normal.     Left Ear: Tympanic membrane and ear canal normal.     Nose:     Right Sinus: No maxillary sinus tenderness.     Left Sinus: No maxillary sinus tenderness.  Eyes:     General: No scleral icterus.       Right eye: No discharge.        Left eye: No discharge.     Conjunctiva/sclera: Conjunctivae  normal.  Neck:     Thyroid: No thyromegaly.     Vascular: No carotid bruit.  Cardiovascular:     Rate and Rhythm: Normal rate and regular rhythm.     Pulses: Normal pulses.     Heart sounds: Normal heart sounds.  Pulmonary:     Effort: Pulmonary effort is normal. No respiratory distress.     Breath sounds: No wheezing.  Chest:  Breasts:    Right: No mass, nipple discharge, skin change or tenderness.     Left: No mass, nipple discharge, skin change or tenderness.       Comments: AICD in place Abdominal:     General: Bowel sounds are normal.     Palpations: Abdomen is soft.     Tenderness: There is no abdominal tenderness.  Musculoskeletal:     Cervical back: Normal range of motion. No erythema.     Right lower leg: No edema.     Left lower leg: No edema.  Lymphadenopathy:     Cervical: No cervical adenopathy.  Skin:    General: Skin is warm and dry.     Capillary Refill: Capillary refill takes less than 2 seconds.     Findings: No rash.  Neurological:     General: No focal deficit present.     Mental Status: She is alert and oriented to person, place, and time.     Cranial Nerves: No cranial nerve deficit.     Sensory: No sensory deficit.     Deep Tendon Reflexes: Reflexes are normal and symmetric.  Psychiatric:        Attention and Perception: Attention normal.        Mood and Affect: Mood normal.    Wt Readings from Last 3 Encounters:  02/10/22 167 lb (75.8 kg)  01/17/22 162 lb (73.5 kg)  07/24/21 162 lb (73.5 kg)    BP 128/64   Pulse 77   Ht $R'5\' 2"'fl$  (1.575 m)   Wt 167 lb (75.8 kg)   SpO2 98%   BMI 30.54 kg/m   Assessment and Plan: 1. Annual physical exam Normal exam Continue healthy diet, regular exercise Immunizations are up to date  2. Encounter for screening mammogram for breast cancer Schedule at  [facility] - MM 3D SCREEN BREAST BILATERAL  3. Colon cancer screening She submitted her FIT last week  4. Essential (primary) hypertension Clinically  stable exam with well controlled BP. Tolerating medications without side effects at this time. Pt to continue current regimen and low sodium diet; benefits of regular exercise as able discussed. - CBC with Differential/Platelet - TSH - POCT urinalysis dipstick  5. Type II diabetes mellitus with complication (HCC) Clinically stable by exam and report without s/s of hypoglycemia. DM complicated by hypertension and dyslipidemia. Tolerating medications well without side effects or other concerns. Followed by Endo - Comprehensive metabolic panel - Hemoglobin A1c  6. Hyperlipidemia associated with type 2 diabetes mellitus ([place]) Tolerating statin medication without side effects at this time LDL is at goal of < 70 on current dose Continue same therapy without change at this time. - Lipid panel  7. Non-ischemic cardiomyopathy (HCC) Stable on current medications. Followed closely by Cardiology  8. B12 nutritional deficiency On oral supplement - check level and advise. - Vitamin B12   Partially dictated using Editor, commissioning. Any errors are unintentional.  Halina Maidens, MD [facility]  02/10/2022

## 2022-02-11 ENCOUNTER — Encounter: Payer: Self-pay | Admitting: Internal Medicine

## 2022-02-11 LAB — COMPREHENSIVE METABOLIC PANEL
ALT: 21 IU/L (ref 0–32)
AST: 23 IU/L (ref 0–40)
Albumin/Globulin Ratio: 1.5 (ref 1.2–2.2)
Albumin: 4.8 g/dL (ref 3.8–4.9)
Alkaline Phosphatase: 148 IU/L — ABNORMAL HIGH (ref 44–121)
BUN/Creatinine Ratio: 13 (ref 12–28)
BUN: 14 mg/dL (ref 8–27)
Bilirubin Total: 0.4 mg/dL (ref 0.0–1.2)
CO2: 22 mmol/L (ref 20–29)
Calcium: 9.7 mg/dL (ref 8.7–10.3)
Chloride: 102 mmol/L (ref 96–106)
Creatinine, Ser: 1.11 mg/dL — ABNORMAL HIGH (ref 0.57–1.00)
Globulin, Total: 3.2 g/dL (ref 1.5–4.5)
Glucose: 101 mg/dL — ABNORMAL HIGH (ref 70–99)
Potassium: 4.3 mmol/L (ref 3.5–5.2)
Sodium: 144 mmol/L (ref 134–144)
Total Protein: 8 g/dL (ref 6.0–8.5)
eGFR: 57 mL/min/{1.73_m2} — ABNORMAL LOW (ref >59)

## 2022-02-11 LAB — CBC WITH DIFFERENTIAL/PLATELET
Basophils Absolute: 0.1 10*3/uL (ref 0.0–0.2)
Basos: 1 %
EOS (ABSOLUTE): 0.6 10*3/uL — ABNORMAL HIGH (ref 0.0–0.4)
Eos: 6 %
Hematocrit: 45.7 % (ref 34.0–46.6)
Hemoglobin: 15.1 g/dL (ref 11.1–15.9)
Immature Grans (Abs): 0 10*3/uL (ref 0.0–0.1)
Immature Granulocytes: 0 %
Lymphocytes Absolute: 2.2 10*3/uL (ref 0.7–3.1)
Lymphs: 23 %
MCH: 28.4 pg (ref 26.6–33.0)
MCHC: 33 g/dL (ref 31.5–35.7)
MCV: 86 fL (ref 79–97)
Monocytes Absolute: 0.7 10*3/uL (ref 0.1–0.9)
Monocytes: 7 %
Neutrophils Absolute: 5.9 10*3/uL (ref 1.4–7.0)
Neutrophils: 63 %
Platelets: 309 10*3/uL (ref 150–450)
RBC: 5.31 x10E6/uL — ABNORMAL HIGH (ref 3.77–5.28)
RDW: 13.5 % (ref 11.7–15.4)
WBC: 9.4 10*3/uL (ref 3.4–10.8)

## 2022-02-11 LAB — LIPID PANEL
Chol/HDL Ratio: 5.1 ratio — ABNORMAL HIGH (ref 0.0–4.4)
Cholesterol, Total: 228 mg/dL — ABNORMAL HIGH (ref 100–199)
HDL: 45 mg/dL (ref >39)
LDL Chol Calc (NIH): 147 mg/dL — ABNORMAL HIGH (ref 0–99)
Triglycerides: 200 mg/dL — ABNORMAL HIGH (ref 0–149)
VLDL Cholesterol Cal: 36 mg/dL (ref 5–40)

## 2022-02-11 LAB — TSH: TSH: 2.15 u[IU]/mL (ref 0.450–4.500)

## 2022-02-11 LAB — HEMOGLOBIN A1C
Est. average glucose Bld gHb Est-mCnc: 163 mg/dL
Hgb A1c MFr Bld: 7.3 % — ABNORMAL HIGH (ref 4.8–5.6)

## 2022-02-11 LAB — VITAMIN B12: Vitamin B-12: >2000 pg/mL — ABNORMAL HIGH (ref 232–1245)

## 2022-02-13 ENCOUNTER — Other Ambulatory Visit: Payer: Self-pay | Admitting: Internal Medicine

## 2022-02-13 DIAGNOSIS — E1169 Type 2 diabetes mellitus with other specified complication: Secondary | ICD-10-CM

## 2022-02-13 MED ORDER — ROSUVASTATIN CALCIUM 10 MG PO TABS: 10.0000 mg | ORAL_TABLET | Freq: Every day | ORAL | 3 refills | Status: DC

## 2022-02-13 NOTE — Telephone Encounter (Signed)
Patients response.  KP

## 2022-02-19 ENCOUNTER — Telehealth: Payer: Self-pay

## 2022-02-19 DIAGNOSIS — Z1211 Encounter for screening for malignant neoplasm of colon: Secondary | ICD-10-CM | POA: Diagnosis not present

## 2022-02-19 NOTE — Telephone Encounter (Signed)
Called pt reminding them to complete the Fecal Occult test we sent pt home with at recent visit. Pt agreed to do so.  - Annissa Andreoni

## 2022-02-22 LAB — FECAL OCCULT BLOOD, IMMUNOCHEMICAL: Fecal Occult Bld: NEGATIVE

## 2022-03-18 ENCOUNTER — Ambulatory Visit
Admission: RE | Admit: 2022-03-18 | Discharge: 2022-03-18 | Disposition: A | Discharge status: Home / Self Care | Payer: BC Managed Care – PPO | Source: Ambulatory Visit | Attending: Internal Medicine | Admitting: Internal Medicine

## 2022-03-18 DIAGNOSIS — Z1231 Encounter for screening mammogram for malignant neoplasm of breast: Secondary | ICD-10-CM | POA: Insufficient documentation

## 2022-03-26 ENCOUNTER — Other Ambulatory Visit: Payer: Self-pay | Admitting: Internal Medicine

## 2022-03-26 DIAGNOSIS — Z780 Asymptomatic menopausal state: Secondary | ICD-10-CM

## 2022-03-26 NOTE — Telephone Encounter (Signed)
Requested Prescriptions  Pending Prescriptions Disp Refills  . medroxyPROGESTERone (PROVERA) 2.5 MG tablet [Pharmacy Med Name: MEDROXYPR AC TAB 2.5MG ] 90 tablet 1    Sig: TAKE 1 TABLET DAILY     OB/GYN:  Progestins Passed - 03/26/2022  2:06 AM      Passed - Last BP in normal range    BP Readings from Last 1 Encounters:  02/10/22 128/64         Passed - Valid encounter within last 12 months    Recent Outpatient Visits          1 month ago Annual physical exam   [facility] Reubin Milan, MD   2 months ago Cellulitis of back except buttock   [facility] Reubin Milan, MD   8 months ago COVID-19   [facility] Reubin Milan, MD   1 year ago Annual physical exam    [facility] Reubin Milan, MD   1 year ago Pneumonia due to COVID-19 virus   [facility] Reubin Milan, MD      Future Appointments            In 10 months Reubin Milan, MD [facility]           Passed - Patient is not a smoker

## 2022-04-15 DIAGNOSIS — I5022 Chronic systolic (congestive) heart failure: Secondary | ICD-10-CM | POA: Diagnosis not present

## 2022-05-08 ENCOUNTER — Telehealth: Payer: Self-pay | Admitting: *Deleted

## 2022-05-08 NOTE — Chronic Care Management (AMB) (Signed)
  Care Coordination  Outreach Note  05/08/2022 Name: QUINCI GAVIDIA MRN: [id number] DOB: 10/16/1960   Care Coordination Outreach Attempts  An unsuccessful telephone outreach was attempted today to offer the patient information about available care coordination services as a benefit of their health plan.   Follow Up Plan:  Additional outreach attempts will be made to offer the patient care coordination information and services.   Encounter Outcome:  No Answer  Gwenevere Ghazi  Care Coordination Care Guide  Direct Dial: [phone]

## 2022-05-13 NOTE — Chronic Care Management (AMB) (Signed)
  Care Coordination  Outreach Note  05/13/2022 Name: Lori Brewer MRN: [id number] DOB: [birth year]   Care Coordination Outreach Attempts  A second unsuccessful outreach was attempted today to offer the patient with information about available care coordination services as a benefit of their health plan.     Follow Up Plan:  Additional outreach attempts will be made to offer the patient care coordination information and services.   Encounter Outcome:  No Answer   Gwenevere Ghazi  Care Coordination Care Guide  Direct Dial: [phone]

## 2022-05-15 DIAGNOSIS — E119 Type 2 diabetes mellitus without complications: Secondary | ICD-10-CM | POA: Diagnosis not present

## 2022-05-15 DIAGNOSIS — Z794 Long term (current) use of insulin: Secondary | ICD-10-CM | POA: Diagnosis not present

## 2022-05-16 NOTE — Chronic Care Management (AMB) (Signed)
  Care Coordination   Note   05/16/2022 Name: JERIANNE ANSELMO MRN: [id number] DOB: 02/23/1961  SHAYONA HIBBITTS is a 61 y.o. year old female who sees Reubin Milan, MD for primary care. I reached out to Ebbie Ridge by phone today to offer care coordination services.  Ms. Milford was given information about Care Coordination services today including:   The Care Coordination services include support from the care team which includes your Nurse Coordinator, Clinical Social Worker, or Pharmacist.  The Care Coordination team is here to help remove barriers to the health concerns and goals most important to you. Care Coordination services are voluntary, and the patient may decline or stop services at any time by request to their care team member.   Care Coordination Consent Status: Patient did not agree to participate in care coordination services at this time.    Encounter Outcome:  Pt. Refused  [facility] Coordination Care Guide  Direct Dial: [phone]

## 2022-05-22 DIAGNOSIS — E1122 Type 2 diabetes mellitus with diabetic chronic kidney disease: Secondary | ICD-10-CM | POA: Diagnosis not present

## 2022-05-22 DIAGNOSIS — Z794 Long term (current) use of insulin: Secondary | ICD-10-CM | POA: Diagnosis not present

## 2022-05-22 DIAGNOSIS — N1831 Chronic kidney disease, stage 3a: Secondary | ICD-10-CM | POA: Diagnosis not present

## 2022-06-25 DIAGNOSIS — I428 Other cardiomyopathies: Secondary | ICD-10-CM | POA: Diagnosis not present

## 2022-06-25 DIAGNOSIS — I5020 Unspecified systolic (congestive) heart failure: Secondary | ICD-10-CM | POA: Diagnosis not present

## 2022-06-25 DIAGNOSIS — Z95810 Presence of automatic (implantable) cardiac defibrillator: Secondary | ICD-10-CM | POA: Diagnosis not present

## 2022-06-25 DIAGNOSIS — I10 Essential (primary) hypertension: Secondary | ICD-10-CM | POA: Diagnosis not present

## 2022-07-09 DIAGNOSIS — E113293 Type 2 diabetes mellitus with mild nonproliferative diabetic retinopathy without macular edema, bilateral: Secondary | ICD-10-CM | POA: Diagnosis not present

## 2022-07-22 DIAGNOSIS — I5022 Chronic systolic (congestive) heart failure: Secondary | ICD-10-CM | POA: Diagnosis not present

## 2022-07-22 DIAGNOSIS — Z95810 Presence of automatic (implantable) cardiac defibrillator: Secondary | ICD-10-CM | POA: Diagnosis not present

## 2022-08-01 ENCOUNTER — Other Ambulatory Visit: Payer: Self-pay | Admitting: Internal Medicine

## 2022-08-01 DIAGNOSIS — Z780 Asymptomatic menopausal state: Secondary | ICD-10-CM

## 2022-08-04 NOTE — Telephone Encounter (Signed)
Requested Prescriptions  Pending Prescriptions Disp Refills   estradiol (ESTRACE) 0.5 MG tablet [Pharmacy Med Name: ESTRADIOL TAB 0.5MG ] 90 tablet 1    Sig: TAKE 1 TABLET DAILY     OB/GYN:  Estrogens Passed - 08/01/2022  8:11 AM      Passed - Mammogram is up-to-date per Health Maintenance      Passed - Last BP in normal range    BP Readings from Last 1 Encounters:  02/10/22 128/64         Passed - Valid encounter within last 12 months    Recent Outpatient Visits           5 months ago Annual physical exam   [place] [facility] at [facility], Nyoka Cowden, MD   6 months ago Cellulitis of back except buttock   [place] [facility] at [facility], Nyoka Cowden, MD   1 year ago COVID-19   [facility] at [facility], Nyoka Cowden, MD   1 year ago Annual physical exam   [facility] at [facility], Nyoka Cowden, MD   1 year ago Pneumonia due to COVID-19 virus   [facility] at [facility], Nyoka Cowden, MD       Future Appointments             In 6 months Judithann Graves, Nyoka Cowden, MD [facility] Care and [facility] Medicine at [facility]

## 2022-09-10 DIAGNOSIS — Z95810 Presence of automatic (implantable) cardiac defibrillator: Secondary | ICD-10-CM | POA: Diagnosis not present

## 2022-09-10 DIAGNOSIS — Z9049 Acquired absence of other specified parts of digestive tract: Secondary | ICD-10-CM | POA: Diagnosis not present

## 2022-09-10 DIAGNOSIS — I428 Other cardiomyopathies: Secondary | ICD-10-CM | POA: Diagnosis not present

## 2022-09-10 DIAGNOSIS — I5022 Chronic systolic (congestive) heart failure: Secondary | ICD-10-CM | POA: Diagnosis not present

## 2022-09-10 DIAGNOSIS — Z01818 Encounter for other preprocedural examination: Secondary | ICD-10-CM | POA: Diagnosis not present

## 2022-09-22 ENCOUNTER — Other Ambulatory Visit: Payer: Self-pay | Admitting: Internal Medicine

## 2022-09-22 DIAGNOSIS — Z780 Asymptomatic menopausal state: Secondary | ICD-10-CM

## 2022-09-23 ENCOUNTER — Other Ambulatory Visit: Payer: Self-pay

## 2022-09-23 ENCOUNTER — Ambulatory Visit
Admission: RE | Admit: 2022-09-23 | Discharge: 2022-09-23 | Disposition: A | Discharge status: Home / Self Care | Payer: BC Managed Care – PPO | Attending: Cardiology | Admitting: Cardiology

## 2022-09-23 ENCOUNTER — Encounter
Admission: RE | Disposition: A | Discharge status: Home / Self Care | Payer: Self-pay | Source: Home / Self Care | Attending: Cardiology

## 2022-09-23 ENCOUNTER — Encounter: Payer: Self-pay | Admitting: Cardiology

## 2022-09-23 DIAGNOSIS — T82111A Breakdown (mechanical) of cardiac pulse generator (battery), initial encounter: Secondary | ICD-10-CM | POA: Diagnosis not present

## 2022-09-23 DIAGNOSIS — Z4502 Encounter for adjustment and management of automatic implantable cardiac defibrillator: Secondary | ICD-10-CM | POA: Insufficient documentation

## 2022-09-23 DIAGNOSIS — I5022 Chronic systolic (congestive) heart failure: Secondary | ICD-10-CM | POA: Diagnosis not present

## 2022-09-23 DIAGNOSIS — Z45010 Encounter for checking and testing of cardiac pacemaker pulse generator [battery]: Secondary | ICD-10-CM

## 2022-09-23 DIAGNOSIS — Z01818 Encounter for other preprocedural examination: Secondary | ICD-10-CM

## 2022-09-23 HISTORY — PX: PPM GENERATOR CHANGEOUT: EP1233

## 2022-09-23 HISTORY — DX: Presence of cardiac pacemaker: Z95.0

## 2022-09-23 HISTORY — DX: Cardiomyopathy, unspecified: I42.9

## 2022-09-23 SURGERY — PPM GENERATOR CHANGEOUT
Anesthesia: Moderate Sedation

## 2022-09-23 MED ORDER — MIDAZOLAM HCL 2 MG/2ML IJ SOLN
INTRAMUSCULAR | Status: AC
  Filled 2022-09-23: qty 2

## 2022-09-23 MED ORDER — SODIUM CHLORIDE 0.9 % IV SOLN: INTRAVENOUS | Status: DC

## 2022-09-23 MED ORDER — MIDAZOLAM HCL 2 MG/2ML IJ SOLN
INTRAMUSCULAR | Status: DC | PRN
  Administered 2022-09-23 (×2): 1 mg via INTRAVENOUS

## 2022-09-23 MED ORDER — ONDANSETRON HCL 4 MG/2ML IJ SOLN: 4.0000 mg | Freq: Four times a day (QID) | INTRAMUSCULAR | Status: DC | PRN

## 2022-09-23 MED ORDER — CEFAZOLIN SODIUM-DEXTROSE 2-4 GM/100ML-% IV SOLN
2.0000 g | INTRAVENOUS | Status: AC
  Administered 2022-09-23: 2 g via INTRAVENOUS

## 2022-09-23 MED ORDER — FENTANYL CITRATE (PF) 100 MCG/2ML IJ SOLN
INTRAMUSCULAR | Status: AC
  Filled 2022-09-23: qty 2

## 2022-09-23 MED ORDER — LIDOCAINE HCL (PF) 1 % IJ SOLN
INTRAMUSCULAR | Status: DC | PRN
  Administered 2022-09-23: 40 mL

## 2022-09-23 MED ORDER — CEPHALEXIN 250 MG PO CAPS: 500.0000 mg | ORAL_CAPSULE | Freq: Two times a day (BID) | ORAL | 0 refills | Status: DC

## 2022-09-23 MED ORDER — CEFAZOLIN SODIUM-DEXTROSE 2-4 GM/100ML-% IV SOLN
INTRAVENOUS | Status: AC
  Filled 2022-09-23: qty 100

## 2022-09-23 MED ORDER — CHLORHEXIDINE GLUCONATE CLOTH 2 % EX PADS
6.0000 | MEDICATED_PAD | Freq: Every day | CUTANEOUS | Status: DC
  Administered 2022-09-23: 6 via TOPICAL

## 2022-09-23 MED ORDER — LIDOCAINE HCL 1 % IJ SOLN
INTRAMUSCULAR | Status: AC
  Filled 2022-09-23: qty 40

## 2022-09-23 MED ORDER — SODIUM CHLORIDE 0.9 % IV SOLN
80.0000 mg | INTRAVENOUS | Status: AC
  Administered 2022-09-23: 80 mg
  Filled 2022-09-23: qty 2

## 2022-09-23 MED ORDER — ACETAMINOPHEN 325 MG PO TABS: 325.0000 mg | ORAL_TABLET | ORAL | Status: DC | PRN

## 2022-09-23 MED ORDER — FENTANYL CITRATE (PF) 100 MCG/2ML IJ SOLN
INTRAMUSCULAR | Status: DC | PRN
  Administered 2022-09-23 (×2): 25 ug via INTRAVENOUS

## 2022-09-23 SURGICAL SUPPLY — 18 items
CABLE SURG 12 DISP A/V CHANNEL (MISCELLANEOUS) IMPLANT
DEVICE DSSCT PLSMBLD 3.0S LGHT (MISCELLANEOUS) IMPLANT
DRAPE INCISE 23X17 IOBAN STRL (DRAPES) ×1
DRAPE INCISE 23X17 STRL (DRAPES) IMPLANT
DRAPE INCISE IOBAN 23X17 STRL (DRAPES) ×1 IMPLANT
ELECT REM PT RETURN 9FT ADLT (ELECTROSURGICAL) ×1
ELECTRODE REM PT RTRN 9FT ADLT (ELECTROSURGICAL) IMPLANT
ICD COBALT XT QUAD CRT DTPA2QQ (ICD Generator) IMPLANT
PAD ELECT DEFIB RADIOL ZOLL (MISCELLANEOUS) IMPLANT
PLASMABLADE 3.0S W/LIGHT (MISCELLANEOUS) ×1
POUCH AIGIS-R ANTIBACT ICD (Mesh General) ×1 IMPLANT
POUCH AIGIS-R ANTIBACT ICD LRG (Mesh General) IMPLANT
SUT VIC AB 2-0 CT1 27 (SUTURE) ×1
SUT VIC AB 2-0 CT1 TAPERPNT 27 (SUTURE) IMPLANT
SUT VICRYL 4-0  27 PS-2 BARIAT (SUTURE) ×1
SUT VICRYL 4-0 27 PS-2 BARIAT (SUTURE) ×1
SUTURE VICRYL 4-0 27 PS-2 BART (SUTURE) IMPLANT
TRAY PACEMAKER INSERTION (PACKS) ×1 IMPLANT

## 2022-09-23 NOTE — Discharge Instructions (Signed)
Patient may shower 09/25/2022.  May remove outer bandage, leave Steri-Strips on.

## 2022-10-02 DIAGNOSIS — I5022 Chronic systolic (congestive) heart failure: Secondary | ICD-10-CM | POA: Diagnosis not present

## 2022-10-02 DIAGNOSIS — Z95810 Presence of automatic (implantable) cardiac defibrillator: Secondary | ICD-10-CM | POA: Diagnosis not present

## 2022-10-02 DIAGNOSIS — I428 Other cardiomyopathies: Secondary | ICD-10-CM | POA: Diagnosis not present

## 2022-10-02 DIAGNOSIS — I5020 Unspecified systolic (congestive) heart failure: Secondary | ICD-10-CM | POA: Diagnosis not present

## 2023-01-26 ENCOUNTER — Other Ambulatory Visit: Payer: Self-pay | Admitting: Internal Medicine

## 2023-01-26 DIAGNOSIS — E1169 Type 2 diabetes mellitus with other specified complication: Secondary | ICD-10-CM

## 2023-01-29 DIAGNOSIS — N1831 Chronic kidney disease, stage 3a: Secondary | ICD-10-CM | POA: Diagnosis not present

## 2023-01-29 DIAGNOSIS — Z01818 Encounter for other preprocedural examination: Secondary | ICD-10-CM | POA: Diagnosis not present

## 2023-01-29 DIAGNOSIS — E1122 Type 2 diabetes mellitus with diabetic chronic kidney disease: Secondary | ICD-10-CM | POA: Diagnosis not present

## 2023-01-29 DIAGNOSIS — Z794 Long term (current) use of insulin: Secondary | ICD-10-CM | POA: Diagnosis not present

## 2023-01-29 LAB — BASIC METABOLIC PANEL
BUN: 15 (ref 4–21)
CO2: 29 — AB (ref 13–22)
Chloride: 101 (ref 99–108)
Creatinine: 1 (ref 0.5–1.1)
Glucose: 117
Potassium: 3.9 mEq/L (ref 3.5–5.1)
Sodium: 139 (ref 137–147)

## 2023-01-29 LAB — HEMOGLOBIN A1C: Hemoglobin A1C: 7.5

## 2023-01-29 LAB — COMPREHENSIVE METABOLIC PANEL: eGFR: 64

## 2023-01-30 DIAGNOSIS — E113293 Type 2 diabetes mellitus with mild nonproliferative diabetic retinopathy without macular edema, bilateral: Secondary | ICD-10-CM | POA: Diagnosis not present

## 2023-02-13 ENCOUNTER — Ambulatory Visit (INDEPENDENT_AMBULATORY_CARE_PROVIDER_SITE_OTHER): Payer: BC Managed Care – PPO | Admitting: Internal Medicine

## 2023-02-13 ENCOUNTER — Encounter: Payer: Self-pay | Admitting: Internal Medicine

## 2023-02-13 VITALS — BP 122/70 | HR 81 | Ht 62.0 in | Wt 165.0 lb

## 2023-02-13 DIAGNOSIS — Z0000 Encounter for general adult medical examination without abnormal findings: Secondary | ICD-10-CM

## 2023-02-13 DIAGNOSIS — I10 Essential (primary) hypertension: Secondary | ICD-10-CM | POA: Diagnosis not present

## 2023-02-13 DIAGNOSIS — E118 Type 2 diabetes mellitus with unspecified complications: Secondary | ICD-10-CM | POA: Diagnosis not present

## 2023-02-13 DIAGNOSIS — E1169 Type 2 diabetes mellitus with other specified complication: Secondary | ICD-10-CM

## 2023-02-13 DIAGNOSIS — Z1231 Encounter for screening mammogram for malignant neoplasm of breast: Secondary | ICD-10-CM | POA: Diagnosis not present

## 2023-02-13 DIAGNOSIS — E785 Hyperlipidemia, unspecified: Secondary | ICD-10-CM

## 2023-02-13 DIAGNOSIS — Z794 Long term (current) use of insulin: Secondary | ICD-10-CM

## 2023-02-13 DIAGNOSIS — Z1211 Encounter for screening for malignant neoplasm of colon: Secondary | ICD-10-CM

## 2023-02-13 DIAGNOSIS — I5020 Unspecified systolic (congestive) heart failure: Secondary | ICD-10-CM

## 2023-02-13 NOTE — Assessment & Plan Note (Addendum)
Tolerating statin medications.  No side effects noted. LDL is  Lab Results  Component Value Date   LDLCALC 147 (H) 02/10/2022  On Crestor 10 mg since last year (changed from atorvastatin for improved lipids)

## 2023-02-13 NOTE — Patient Instructions (Signed)
Call [facility] to schedule your mammogram at [phone].  

## 2023-02-13 NOTE — Assessment & Plan Note (Signed)
Stable clinically on Entresto and Lasix.  Recent pacemaker battery change. Followed by Cardiology.

## 2023-02-13 NOTE — Assessment & Plan Note (Signed)
Stable exam with well controlled BP.  Currently taking Entresto, lisinopril, coreg and lasix. Tolerating medications without concerns or side effects. Will continue to recommend low sodium diet and current regimen.

## 2023-02-13 NOTE — Assessment & Plan Note (Addendum)
Blood sugars stable without hypoglycemic symptoms or events. Currently being treated with insulin and Farxiga by Endo. Lab Results  Component Value Date   HGBA1C 7.5 01/29/2023

## 2023-02-13 NOTE — Progress Notes (Signed)
Date:  02/13/2023   Name:  Lori Brewer   DOB:  [birth year]   MRN:  [id number]   Chief Complaint: Annual Exam Lori Brewer is a 62 y.o. female who presents today for her Complete Annual Exam. She feels well. She reports exercising some. She reports she is sleeping well. Breast complaints - none.  Mammogram: 03/2022 DEXA: none Pap smear: 01/2019 neg/neg Colonoscopy: FIT 02/2022  Health Maintenance Due  Topic Date Due   HIV Screening  Never done   DTaP/Tdap/Td (1 - Tdap) Never done   Colonoscopy  Never done   Zoster Vaccines- Shingrix (1 of 2) Never done   Diabetic kidney evaluation - Urine ACR  07/03/2022   OPHTHALMOLOGY EXAM  01/07/2023    Immunization History  Administered Date(s) Administered   Influenza-Unspecified 06/13/2016, 07/09/2020   PFIZER(Purple Top)SARS-COV-2 Vaccination 11/29/2019, 12/20/2019, 08/11/2020   Pneumococcal Polysaccharide-23 12/10/2017    Diabetes She presents for her follow-up diabetic visit. She has type 2 diabetes mellitus. Her disease course has been stable. Pertinent negatives for hypoglycemia include no dizziness, headaches, nervousness/anxiousness or tremors. Pertinent negatives for diabetes include no chest pain, no fatigue, no polydipsia and no polyuria. Her weight is stable. An ACE inhibitor/angiotensin II receptor blocker is being taken. Eye exam is not current.  Hyperlipidemia This is a chronic problem. The problem is uncontrolled. Pertinent negatives include no chest pain or shortness of breath. Current antihyperlipidemic treatment includes statins.  Hypertension This is a chronic problem. The problem is controlled. Pertinent negatives include no chest pain, headaches, palpitations or shortness of breath. Hypertensive end-organ damage includes CAD/MI. There is no history of kidney disease.  HRT - doing well on estrogen and progesterone.  No headaches, edema, or bleeding.  Hot flashes well controlled.   Lab Results  Component Value Date   NA  139 01/29/2023   K 3.9 01/29/2023   CO2 29 (A) 01/29/2023   GLUCOSE 101 (H) 02/10/2022   BUN 15 01/29/2023   CREATININE 1.0 01/29/2023   CALCIUM 9.7 02/10/2022   EGFR 64 01/29/2023   GFRNONAA 79 01/31/2020   Lab Results  Component Value Date   CHOL 228 (H) 02/10/2022   HDL 45 02/10/2022   LDLCALC 147 (H) 02/10/2022   TRIG 200 (H) 02/10/2022   CHOLHDL 5.1 (H) 02/10/2022   Lab Results  Component Value Date   TSH 2.150 02/10/2022   Lab Results  Component Value Date   HGBA1C 7.5 01/29/2023   Lab Results  Component Value Date   WBC 9.4 02/10/2022   HGB 15.1 02/10/2022   HCT 45.7 02/10/2022   MCV 86 02/10/2022   PLT 309 02/10/2022   Lab Results  Component Value Date   ALT 21 02/10/2022   AST 23 02/10/2022   ALKPHOS 148 (H) 02/10/2022   BILITOT 0.4 02/10/2022   No results found for: "25OHVITD2", "25OHVITD3", "VD25OH"   Review of Systems  Constitutional:  Negative for chills, fatigue and fever.  HENT:  Negative for congestion, hearing loss, tinnitus, trouble swallowing and voice change.   Eyes:  Negative for visual disturbance.  Respiratory:  Negative for cough, chest tightness, shortness of breath and wheezing.   Cardiovascular:  Negative for chest pain, palpitations and leg swelling.  Gastrointestinal:  Negative for abdominal pain, constipation, diarrhea and vomiting.  Endocrine: Negative for polydipsia and polyuria.  Genitourinary:  Negative for dysuria, frequency, genital sores, vaginal bleeding and vaginal discharge.  Musculoskeletal:  Negative for arthralgias, gait problem and joint swelling.  Skin:  Negative for color change and rash.  Neurological:  Negative for dizziness, tremors, light-headedness and headaches.  Hematological:  Negative for adenopathy. Does not bruise/bleed easily.  Psychiatric/Behavioral:  Negative for dysphoric mood and sleep disturbance. The patient is not nervous/anxious.     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   Non-ischemic cardiomyopathy (HCC) 07/14/2015   Small airways disease 06/19/2015   Menopause 05/15/2015   Anxiety, mild 03/30/2015   Type II diabetes mellitus with complication (HCC) 03/30/2015   Essential (primary) hypertension 03/30/2015   Hyperlipidemia associated with type 2 diabetes mellitus (HCC) 03/30/2015    Allergies  Allergen Reactions   Januvia [Sitagliptin] Nausea Only    Past Surgical History:  Procedure Laterality Date   CARDIAC CATHETERIZATION N/A 07/12/2015   Procedure: Right and Left Heart Cath;  Surgeon: Alwyn Pea, MD;  Location: [facility];  Service: Cardiovascular;  Laterality: N/A;   CARDIAC DEFIBRILLATOR PLACEMENT  01/2016   CESAREAN SECTION     GALLBLADDER SURGERY     PPM GENERATOR CHANGEOUT N/A 09/23/2022   Procedure: PPM GENERATOR CHANGEOUT;  Surgeon: Marcina Millard, MD;  Location: [facility];  Service: Cardiovascular;  Laterality: N/A;    Social History   Tobacco Use   Smoking status: Former   Smokeless tobacco: Never  Vaping Use   Vaping Use: Never used  Substance Use Topics   Alcohol use: Yes    Alcohol/week: 2.0 standard drinks of alcohol    Types: 2 Cans of beer per week    Comment: occasional   Drug use: No     Medication list has been reviewed and updated.  Current Meds  Medication Sig   B-D ULTRAFINE III SHORT PEN 31G X 8 MM MISC    carvedilol (COREG) 3.125 MG tablet Take 1 tablet by mouth 2 (two) times daily.   ENTRESTO 24-26 MG Take 1 tablet by mouth 2 (two) times daily.   estradiol (ESTRACE) 0.5 MG tablet TAKE 1 TABLET DAILY   FARXIGA 10 MG TABS tablet Take 10 mg by mouth daily.    furosemide (LASIX) 40 MG tablet Take 40 mg by mouth daily.   glucose blood test strip Use as instructed   insulin degludec (TRESIBA) 100 UNIT/ML FlexTouch Pen Inject 40 Units into the skin daily at 10 pm.   lisinopril (PRINIVIL,ZESTRIL) 40 MG tablet Take 1 tablet by mouth daily.   medroxyPROGESTERone (PROVERA) 2.5 MG tablet TAKE 1 TABLET DAILY   ONETOUCH DELICA LANCETS 33G MISC    rosuvastatin (CRESTOR) 10 MG tablet TAKE 1 TABLET DAILY       02/13/2023    8:02 AM 02/10/2022    8:00 AM 07/24/2021   11:35 AM 02/01/2021    8:32 AM  GAD 7 : Generalized Anxiety Score  Nervous, Anxious, on Edge 0 0 0 1  Control/stop worrying 0 0 0 0  Worry too much - different things 0 1 0 0  Trouble relaxing 0 1 0 1  Restless 0 0 0 1  Easily annoyed or irritable 0 0 0 0  Afraid - awful might happen 0 0 0 1  Total GAD 7 Score 0 2 0 4  Anxiety Difficulty Not difficult at all  Not  difficult at all        02/13/2023    8:02 AM 02/10/2022    8:00 AM 07/24/2021   11:35 AM  Depression screen PHQ 2/9  Decreased Interest 0 0 0  Down, Depressed, Hopeless 0 0 0  PHQ - 2 Score 0 0 0  Altered sleeping 0 1 0  Tired, decreased energy 1 1 0  Change in appetite 0 0 0  Feeling bad or failure about yourself  0 0 0  Trouble concentrating 0 0 0  Moving slowly or fidgety/restless 0 0 0  Suicidal thoughts 0 0 0  PHQ-9 Score 1 2 0  Difficult doing work/chores Not difficult at all Not difficult at all Not difficult at all    BP Readings from Last 3 Encounters:  02/13/23 122/70  09/23/22 (!) 121/90  02/10/22 128/64    Physical Exam Vitals and nursing note reviewed.  Constitutional:      General: She is not in acute distress.    Appearance: She is well-developed.  HENT:     Head: Normocephalic and atraumatic.     Right Ear: Tympanic membrane and ear canal normal.     Left Ear: Tympanic membrane and ear canal normal.     Nose:     Right Sinus: No maxillary sinus tenderness.     Left Sinus: No maxillary sinus  tenderness.  Eyes:     General: No scleral icterus.       Right eye: No discharge.        Left eye: No discharge.     Conjunctiva/sclera: Conjunctivae normal.  Neck:     Thyroid: No thyromegaly.     Vascular: No carotid bruit.  Cardiovascular:     Rate and Rhythm: Normal rate and regular rhythm.     Pulses: Normal pulses.     Heart sounds: Normal heart sounds.  Pulmonary:     Effort: Pulmonary effort is normal. No respiratory distress.     Breath sounds: No wheezing.  Chest:  Breasts:    Right: No mass, nipple discharge, skin change or tenderness.     Left: No mass, nipple discharge, skin change or tenderness.  Abdominal:     General: Bowel sounds are normal.     Palpations: Abdomen is soft.     Tenderness: There is no abdominal tenderness.  Musculoskeletal:     Cervical back: Normal range of motion. No erythema.     Right lower leg: No edema.     Left lower leg: No edema.  Lymphadenopathy:     Cervical: No cervical adenopathy.  Skin:    General: Skin is warm and dry.     Findings: No rash.  Neurological:     General: No focal deficit present.     Mental Status: She is alert and oriented to person, place, and time.     Cranial Nerves: No cranial nerve deficit.     Sensory: No sensory deficit.     Deep Tendon Reflexes: Reflexes are normal and symmetric.  Psychiatric:        Attention and Perception: Attention normal.        Mood and Affect: Mood normal.    Diabetic Foot Exam - Simple   Simple Foot Form Diabetic Foot exam was performed with the following findings: Yes 02/13/2023  8:13 AM  Visual Inspection No deformities, no ulcerations, no other skin breakdown bilaterally: Yes Sensation Testing Intact to touch and monofilament testing bilaterally: Yes Pulse Check Posterior Tibialis and Dorsalis  pulse intact bilaterally: Yes Comments      Wt Readings from Last 3 Encounters:  02/13/23 165 lb (74.8 kg)  09/23/22 165 lb (74.8 kg)  02/10/22 167 lb (75.8 kg)     BP 122/70   Pulse 81   Ht 5\' 2"  (1.575 m)   Wt 165 lb (74.8 kg)   SpO2 96%   BMI 30.18 kg/m   Assessment and Plan:  Problem List Items Addressed This Visit     Type II diabetes mellitus with complication (HCC) (Chronic)    Blood sugars stable without hypoglycemic symptoms or events. Currently being treated with insulin and Farxiga by Endo. Lab Results  Component Value Date   HGBA1C 7.5 01/29/2023        Relevant Orders   Microalbumin / creatinine urine ratio   Hyperlipidemia associated with type 2 diabetes mellitus (HCC) (Chronic)    Tolerating statin medications.  No side effects noted. LDL is  Lab Results  Component Value Date   LDLCALC 147 (H) 02/10/2022  On Crestor 10 mg since last year (changed from atorvastatin for improved lipids)        Relevant Orders   Lipid panel   Hepatic function panel   HFrEF (heart failure with reduced ejection fraction) (HCC) (Chronic)    Stable clinically on Entresto and Lasix.  Recent pacemaker battery change. Followed by Cardiology.      Essential (primary) hypertension (Chronic)    Stable exam with well controlled BP.  Currently taking Entresto, lisinopril, coreg and lasix. Tolerating medications without concerns or side effects. Will continue to recommend low sodium diet and current regimen.       Relevant Orders   CBC with Differential/Platelet   Other Visit Diagnoses     Annual physical exam    -  Primary   up to date on screenings will return for Prevnar-20   Relevant Orders   Lipid panel   Microalbumin / creatinine urine ratio   CBC with Differential/Platelet   Hepatic function panel   Encounter for screening mammogram for breast cancer       Relevant Orders   MM 3D SCREENING MAMMOGRAM BILATERAL BREAST   Colon cancer screening       Relevant Orders   Fecal occult blood, imunochemical   Current use of insulin (HCC)           Return in about 1 year (around 02/13/2024) for CPX.   Partially dictated  using Dragon software, any errors are not intentional.  Reubin Milan, MD [facility] [place], [place]

## 2023-02-15 ENCOUNTER — Encounter: Payer: Self-pay | Admitting: Internal Medicine

## 2023-02-15 LAB — CBC WITH DIFFERENTIAL/PLATELET
Basophils Absolute: 0.1 10*3/uL (ref 0.0–0.2)
Basos: 1 %
EOS (ABSOLUTE): 0.6 10*3/uL — ABNORMAL HIGH (ref 0.0–0.4)
Eos: 5 %
Hematocrit: 43.4 % (ref 34.0–46.6)
Hemoglobin: 14.3 g/dL (ref 11.1–15.9)
Immature Grans (Abs): 0 10*3/uL (ref 0.0–0.1)
Immature Granulocytes: 0 %
Lymphocytes Absolute: 3 10*3/uL (ref 0.7–3.1)
Lymphs: 25 %
MCH: 28.5 pg (ref 26.6–33.0)
MCHC: 32.9 g/dL (ref 31.5–35.7)
MCV: 87 fL (ref 79–97)
Monocytes Absolute: 0.8 10*3/uL (ref 0.1–0.9)
Monocytes: 7 %
Neutrophils Absolute: 7.4 10*3/uL — ABNORMAL HIGH (ref 1.4–7.0)
Neutrophils: 62 %
Platelets: 289 10*3/uL (ref 150–450)
RBC: 5.02 x10E6/uL (ref 3.77–5.28)
RDW: 13.6 % (ref 11.7–15.4)
WBC: 11.9 10*3/uL — ABNORMAL HIGH (ref 3.4–10.8)

## 2023-02-15 LAB — HEPATIC FUNCTION PANEL
ALT: 14 IU/L (ref 0–32)
AST: 15 IU/L (ref 0–40)
Albumin: 4.8 g/dL (ref 3.9–4.9)
Alkaline Phosphatase: 140 IU/L — ABNORMAL HIGH (ref 44–121)
Bilirubin Total: 0.4 mg/dL (ref 0.0–1.2)
Bilirubin, Direct: 0.14 mg/dL (ref 0.00–0.40)
Total Protein: 7.7 g/dL (ref 6.0–8.5)

## 2023-02-15 LAB — MICROALBUMIN / CREATININE URINE RATIO
Creatinine, Urine: 93.7 mg/dL
Microalb/Creat Ratio: 6 mg/g creat (ref 0–29)
Microalbumin, Urine: 5.2 ug/mL

## 2023-02-15 LAB — LIPID PANEL
Chol/HDL Ratio: 3.9 ratio (ref 0.0–4.4)
Cholesterol, Total: 155 mg/dL (ref 100–199)
HDL: 40 mg/dL (ref >39)
LDL Chol Calc (NIH): 83 mg/dL (ref 0–99)
Triglycerides: 185 mg/dL — ABNORMAL HIGH (ref 0–149)
VLDL Cholesterol Cal: 32 mg/dL (ref 5–40)

## 2023-02-24 DIAGNOSIS — Z1211 Encounter for screening for malignant neoplasm of colon: Secondary | ICD-10-CM | POA: Diagnosis not present

## 2023-03-04 LAB — FECAL OCCULT BLOOD, IMMUNOCHEMICAL: Fecal Occult Bld: NEGATIVE

## 2023-03-14 ENCOUNTER — Other Ambulatory Visit: Payer: Self-pay | Admitting: Internal Medicine

## 2023-03-14 DIAGNOSIS — Z780 Asymptomatic menopausal state: Secondary | ICD-10-CM

## 2023-04-28 DIAGNOSIS — E1122 Type 2 diabetes mellitus with diabetic chronic kidney disease: Secondary | ICD-10-CM | POA: Diagnosis not present

## 2023-04-28 DIAGNOSIS — Z794 Long term (current) use of insulin: Secondary | ICD-10-CM | POA: Diagnosis not present

## 2023-04-28 DIAGNOSIS — N1831 Chronic kidney disease, stage 3a: Secondary | ICD-10-CM | POA: Diagnosis not present

## 2023-04-28 LAB — PROTEIN / CREATININE RATIO, URINE
Albumin, U: 7
Creatinine, Urine: 28

## 2023-04-28 LAB — MICROALBUMIN / CREATININE URINE RATIO: Microalb Creat Ratio: 23

## 2023-04-28 LAB — HEMOGLOBIN A1C: Hemoglobin A1C: 7.4

## 2023-05-05 DIAGNOSIS — E1122 Type 2 diabetes mellitus with diabetic chronic kidney disease: Secondary | ICD-10-CM | POA: Diagnosis not present

## 2023-05-05 DIAGNOSIS — N1831 Chronic kidney disease, stage 3a: Secondary | ICD-10-CM | POA: Diagnosis not present

## 2023-05-05 DIAGNOSIS — Z794 Long term (current) use of insulin: Secondary | ICD-10-CM | POA: Diagnosis not present

## 2023-05-24 ENCOUNTER — Encounter: Payer: Self-pay | Admitting: Internal Medicine

## 2023-05-27 DIAGNOSIS — I5020 Unspecified systolic (congestive) heart failure: Secondary | ICD-10-CM | POA: Diagnosis not present

## 2023-05-27 DIAGNOSIS — Z95810 Presence of automatic (implantable) cardiac defibrillator: Secondary | ICD-10-CM | POA: Diagnosis not present

## 2023-05-27 DIAGNOSIS — I5022 Chronic systolic (congestive) heart failure: Secondary | ICD-10-CM | POA: Diagnosis not present

## 2023-05-27 DIAGNOSIS — I480 Paroxysmal atrial fibrillation: Secondary | ICD-10-CM | POA: Diagnosis not present

## 2023-06-09 DIAGNOSIS — Z8249 Family history of ischemic heart disease and other diseases of the circulatory system: Secondary | ICD-10-CM | POA: Diagnosis not present

## 2023-06-09 DIAGNOSIS — I5032 Chronic diastolic (congestive) heart failure: Secondary | ICD-10-CM | POA: Diagnosis not present

## 2023-06-23 DIAGNOSIS — I5022 Chronic systolic (congestive) heart failure: Secondary | ICD-10-CM | POA: Diagnosis not present

## 2023-08-13 DIAGNOSIS — Z794 Long term (current) use of insulin: Secondary | ICD-10-CM | POA: Diagnosis not present

## 2023-08-13 DIAGNOSIS — N1831 Chronic kidney disease, stage 3a: Secondary | ICD-10-CM | POA: Diagnosis not present

## 2023-08-13 DIAGNOSIS — E1122 Type 2 diabetes mellitus with diabetic chronic kidney disease: Secondary | ICD-10-CM | POA: Diagnosis not present

## 2023-08-20 DIAGNOSIS — Z794 Long term (current) use of insulin: Secondary | ICD-10-CM | POA: Diagnosis not present

## 2023-08-20 DIAGNOSIS — E1122 Type 2 diabetes mellitus with diabetic chronic kidney disease: Secondary | ICD-10-CM | POA: Diagnosis not present

## 2023-08-20 DIAGNOSIS — N182 Chronic kidney disease, stage 2 (mild): Secondary | ICD-10-CM | POA: Diagnosis not present

## 2023-08-21 ENCOUNTER — Other Ambulatory Visit: Payer: Self-pay | Admitting: Internal Medicine

## 2023-08-21 DIAGNOSIS — Z780 Asymptomatic menopausal state: Secondary | ICD-10-CM

## 2023-08-21 MED ORDER — ESTRADIOL 0.5 MG PO TABS: 0.5000 mg | ORAL_TABLET | Freq: Every day | ORAL | 3 refills | Status: DC

## 2023-09-03 ENCOUNTER — Other Ambulatory Visit: Payer: Self-pay | Admitting: Internal Medicine

## 2023-09-03 DIAGNOSIS — Z780 Asymptomatic menopausal state: Secondary | ICD-10-CM

## 2023-09-04 NOTE — Telephone Encounter (Signed)
Requested Prescriptions  Pending Prescriptions Disp Refills   medroxyPROGESTERone (PROVERA) 2.5 MG tablet [Pharmacy Med Name: MEDROXYPR AC TAB 2.5MG ] 90 tablet 1    Sig: TAKE 1 TABLET DAILY     OB/GYN:  Progestins Passed - 09/04/2023 12:55 PM      Passed - Last BP in normal range    BP Readings from Last 1 Encounters:  02/13/23 122/70         Passed - Valid encounter within last 12 months    Recent Outpatient Visits           6 months ago Annual physical exam   [place] [facility] at [facility], Nyoka Cowden, MD   1 year ago Annual physical exam   [facility] at [facility], Nyoka Cowden, MD   1 year ago Cellulitis of back except buttock   [place] [facility] at [facility], Nyoka Cowden, MD   2 years ago COVID-19   [facility] at [facility], Nyoka Cowden, MD   2 years ago Annual physical exam   [facility] at [facility], Nyoka Cowden, MD       Future Appointments             In 5 months Judithann Graves, Nyoka Cowden, MD [facility] at [facility], [facility]            Passed - Patient is not a smoker

## 2023-10-14 ENCOUNTER — Encounter: Payer: Self-pay | Admitting: Internal Medicine

## 2023-11-09 ENCOUNTER — Ambulatory Visit
Admission: RE | Admit: 2023-11-09 | Discharge: 2023-11-09 | Disposition: A | Discharge status: Home / Self Care | Payer: BC Managed Care – PPO | Source: Ambulatory Visit | Attending: Internal Medicine | Admitting: Internal Medicine

## 2023-11-09 DIAGNOSIS — Z1231 Encounter for screening mammogram for malignant neoplasm of breast: Secondary | ICD-10-CM | POA: Insufficient documentation

## 2023-11-12 ENCOUNTER — Other Ambulatory Visit: Payer: Self-pay | Admitting: Internal Medicine

## 2023-11-12 DIAGNOSIS — R928 Other abnormal and inconclusive findings on diagnostic imaging of breast: Secondary | ICD-10-CM

## 2023-11-16 ENCOUNTER — Encounter: Payer: Self-pay | Admitting: Internal Medicine

## 2023-11-16 NOTE — Telephone Encounter (Signed)
 Please review.  KP

## 2023-12-02 ENCOUNTER — Other Ambulatory Visit (HOSPITAL_COMMUNITY): Payer: Self-pay | Admitting: Internal Medicine

## 2023-12-02 DIAGNOSIS — Z8679 Personal history of other diseases of the circulatory system: Secondary | ICD-10-CM

## 2023-12-11 ENCOUNTER — Encounter: Payer: Self-pay | Admitting: Internal Medicine

## 2023-12-11 ENCOUNTER — Other Ambulatory Visit: Payer: Self-pay

## 2023-12-11 DIAGNOSIS — R928 Other abnormal and inconclusive findings on diagnostic imaging of breast: Secondary | ICD-10-CM | POA: Insufficient documentation

## 2023-12-11 NOTE — Telephone Encounter (Signed)
 Please review.  KP

## 2023-12-14 ENCOUNTER — Other Ambulatory Visit: Payer: Self-pay | Admitting: Internal Medicine

## 2023-12-14 DIAGNOSIS — R4589 Other symptoms and signs involving emotional state: Secondary | ICD-10-CM

## 2023-12-14 MED ORDER — DIAZEPAM 5 MG PO TABS: ORAL_TABLET | ORAL | 0 refills | Status: AC

## 2023-12-14 NOTE — Telephone Encounter (Signed)
Pts response.  KP

## 2023-12-29 ENCOUNTER — Encounter: Payer: Self-pay | Admitting: Internal Medicine

## 2023-12-30 ENCOUNTER — Ambulatory Visit: Payer: Self-pay

## 2023-12-30 ENCOUNTER — Encounter: Payer: Self-pay | Admitting: Internal Medicine

## 2023-12-30 DIAGNOSIS — C50919 Malignant neoplasm of unspecified site of unspecified female breast: Secondary | ICD-10-CM | POA: Insufficient documentation

## 2023-12-30 NOTE — Telephone Encounter (Signed)
 [facility] calling to give stat lab results, per agent. Agent transferred to CAL before speaking with NT.   Copied from CRM [id number]. Topic: Clinical - Lab/Test Results >> Dec 30, 2023  4:08 PM [place] J wrote: Reason for CRM: Lori Brewer calling in to release imaging results.

## 2024-01-13 ENCOUNTER — Other Ambulatory Visit: Payer: Self-pay | Admitting: Internal Medicine

## 2024-01-13 ENCOUNTER — Encounter: Payer: Self-pay | Admitting: Internal Medicine

## 2024-01-13 NOTE — Progress Notes (Unsigned)
 Date:  01/13/2024   Name:  Lori Brewer   DOB:  Apr 01, 1961   MRN:  [id number]   Chief Complaint: No chief complaint on file.  HPI  Review of Systems   Lab Results  Component Value Date   NA 139 01/29/2023   K 3.9 01/29/2023   CO2 29 (A) 01/29/2023   GLUCOSE 101 (H) 02/10/2022   BUN 15 01/29/2023   CREATININE 1.0 01/29/2023   CALCIUM  9.7 02/10/2022   EGFR 64 01/29/2023   GFRNONAA 79 01/31/2020   Lab Results  Component Value Date   CHOL 155 02/13/2023   HDL 40 02/13/2023   LDLCALC 83 02/13/2023   TRIG 185 (H) 02/13/2023   CHOLHDL 3.9 02/13/2023   Lab Results  Component Value Date   TSH 2.150 02/10/2022   Lab Results  Component Value Date   HGBA1C 7.4 04/28/2023   Lab Results  Component Value Date   WBC 11.9 (H) 02/13/2023   HGB 14.3 02/13/2023   HCT 43.4 02/13/2023   MCV 87 02/13/2023   PLT 289 02/13/2023   Lab Results  Component Value Date   ALT 14 02/13/2023   AST 15 02/13/2023   ALKPHOS 140 (H) 02/13/2023   BILITOT 0.4 02/13/2023   No results found for: "25OHVITD2", "25OHVITD3", "VD25OH"   Patient Active Problem List   Diagnosis Date Noted   Invasive ductal carcinoma of breast, female (HCC) 12/30/2023   Abnormal mammogram of left breast 12/11/2023   HFrEF (heart failure with reduced ejection fraction) (HCC) 01/01/2022   B12 nutritional deficiency 02/04/2021   Generalized anxiety disorder 05/15/2017   Environmental and seasonal allergies 05/15/2017   Elevated WBC count 07/03/2016   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   Non-ischemic cardiomyopathy (HCC) 07/14/2015   Small airways disease 06/19/2015   Menopause 05/15/2015   Anxiety, mild 03/30/2015   Type II diabetes mellitus with complication (HCC) 03/30/2015   Essential (primary) hypertension 03/30/2015   Hyperlipidemia associated with type 2 diabetes mellitus (HCC) 03/30/2015    Allergies  Allergen Reactions   Januvia [Sitagliptin] Nausea Only     Past Surgical History:  Procedure Laterality Date   CARDIAC CATHETERIZATION N/A 07/12/2015   Procedure: Right and Left Heart Cath;  Surgeon: Antonette Batters, MD;  Location: [facility];  Service: Cardiovascular;  Laterality: N/A;   CARDIAC DEFIBRILLATOR PLACEMENT  01/2016   CESAREAN SECTION     GALLBLADDER SURGERY     PPM GENERATOR CHANGEOUT N/A 09/23/2022   Procedure: PPM GENERATOR CHANGEOUT;  Surgeon: Percival Brace, MD;  Location: [facility];  Service: Cardiovascular;  Laterality: N/A;    Social History   Tobacco Use   Smoking status: Former   Smokeless tobacco: Never  Vaping Use   Vaping status: Never Used  Substance Use Topics   Alcohol use: Yes    Alcohol/week: 2.0 standard drinks of alcohol    Types: 2 Cans of beer per week    Comment: occasional   Drug use: No     Medication list has been reviewed and updated.  No outpatient medications have been marked as taking for the 01/13/24 encounter (Orders Only) with Sheron Dixons, MD.       02/13/2023    8:02 AM 02/10/2022    8:00 AM 07/24/2021   11:35 AM 02/01/2021    8:32 AM  GAD 7 : Generalized Anxiety Score  Nervous, Anxious, on Edge 0 0 0 1  Control/stop worrying 0 0 0 0  Worry too much - different things 0 1 0 0  Trouble relaxing 0 1 0 1  Restless 0 0 0 1  Easily annoyed or irritable 0 0 0 0  Afraid - awful might happen 0 0 0 1  Total GAD 7 Score 0 2 0 4  Anxiety Difficulty Not difficult at all  Not difficult at all        02/13/2023    8:02 AM 02/10/2022    8:00 AM 07/24/2021   11:35 AM  Depression screen PHQ 2/9  Decreased Interest 0 0 0  Down, Depressed, Hopeless 0 0 0  PHQ - 2 Score 0 0 0  Altered sleeping 0 1 0  Tired, decreased energy 1 1 0  Change in appetite 0 0 0  Feeling bad or failure about yourself  0 0 0  Trouble concentrating 0 0 0  Moving slowly or fidgety/restless 0 0 0  Suicidal thoughts 0 0 0  PHQ-9 Score 1 2 0  Difficult doing work/chores Not difficult  at all Not difficult at all Not difficult at all    BP Readings from Last 3 Encounters:  02/13/23 122/70  09/23/22 (!) 121/90  02/10/22 128/64    Physical Exam  Wt Readings from Last 3 Encounters:  02/13/23 165 lb (74.8 kg)  09/23/22 165 lb (74.8 kg)  02/10/22 167 lb (75.8 kg)    There were no vitals taken for this visit.  Assessment and Plan:  Problem List Items Addressed This Visit   None   No follow-ups on file.    Sheron Dixons, MD [facility] Medicine [place]

## 2024-01-13 NOTE — Telephone Encounter (Signed)
 Please review

## 2024-01-14 ENCOUNTER — Encounter (HOSPITAL_COMMUNITY): Payer: Self-pay

## 2024-01-14 ENCOUNTER — Other Ambulatory Visit (HOSPITAL_COMMUNITY)

## 2024-01-14 ENCOUNTER — Other Ambulatory Visit: Payer: Self-pay | Admitting: Internal Medicine

## 2024-01-14 DIAGNOSIS — E1169 Type 2 diabetes mellitus with other specified complication: Secondary | ICD-10-CM

## 2024-01-15 NOTE — Telephone Encounter (Signed)
 Requested medication (s) are due for refill today: yes  Requested medication (s) are on the active medication list: yes  Last refill:  01/26/23 #90 3 RF  Future visit scheduled: yes  Notes to clinic:  was unsure what amount to give- labs still have a few days left until due but has appt next month. Was unsure if could refill 90 days (since it is mail order).    Requested Prescriptions  Pending Prescriptions Disp Refills   rosuvastatin  (CRESTOR ) 10 MG tablet [Pharmacy Med Name: ROSUVASTATIN  TAB 10MG ] 90 tablet 3    Sig: TAKE 1 TABLET DAILY     Cardiovascular:  Antilipid - Statins 2 Failed - 01/15/2024  2:41 PM      Failed - Valid encounter within last 12 months    Recent Outpatient Visits   None     Future Appointments             In 1 month Gala Jubilee, [place], MD [facility] Medicine at [facility], [facility]            Failed - Lipid Panel in normal range within the last 12 months    Cholesterol, Total  Date Value Ref Range Status  02/13/2023 155 100 - 199 mg/dL Final   LDL Chol Calc (NIH)  Date Value Ref Range Status  02/13/2023 83 0 - 99 mg/dL Final   HDL  Date Value Ref Range Status  02/13/2023 40 >39 mg/dL Final   Triglycerides  Date Value Ref Range Status  02/13/2023 185 (H) 0 - 149 mg/dL Final         Passed - Cr in normal range and within 360 days    Creatinine  Date Value Ref Range Status  01/29/2023 1.0 0.5 - 1.1 Final   Creatinine, Ser  Date Value Ref Range Status  02/10/2022 1.11 (H) 0.57 - 1.00 mg/dL Final   Creatinine, Urine  Date Value Ref Range Status  04/28/2023 28  Final         Passed - Patient is not pregnant

## 2024-02-17 ENCOUNTER — Encounter: Payer: Self-pay | Admitting: Internal Medicine

## 2024-04-11 ENCOUNTER — Telehealth: Payer: Self-pay

## 2024-04-11 NOTE — Transitions of Care (Post Inpatient/ED Visit) (Unsigned)
   04/11/2024  Name: Lori Brewer MRN: [id number] DOB: [birth year]  Today's TOC FU Call Status: Today's TOC FU Call Status:: Unsuccessful Call (1st Attempt) Unsuccessful Call (1st Attempt) Date: 04/11/24  Attempted to reach the patient regarding the most recent Inpatient/ED visit.  Follow Up Plan: Additional outreach attempts will be made to reach the patient to complete the Transitions of Care (Post Inpatient/ED visit) call.   [name]  Primary Care & Sports Medicine at [facility] [place] CMA, AAMA [address]  [place] [address] Office [phone]  Fax: [phone]

## 2024-04-13 NOTE — Transitions of Care (Post Inpatient/ED Visit) (Unsigned)
   04/13/2024  Name: Lori Brewer MRN: [id number] DOB: 06-08-1961  Today's TOC FU Call Status: Today's TOC FU Call Status:: Unsuccessful Call (2nd Attempt) Unsuccessful Call (1st Attempt) Date: 04/11/24 Unsuccessful Call (2nd Attempt) Date: 04/13/24  Attempted to reach the patient regarding the most recent Inpatient/ED visit.  Follow Up Plan: Additional outreach attempts will be made to reach the patient to complete the Transitions of Care (Post Inpatient/ED visit) call.  Jeniffer Culliver [place]  Primary Care & Sports Medicine at [facility] [place] CMA, AAMA [address]  [place] [address] Office [phone]  Fax: [phone]

## 2024-04-14 NOTE — Transitions of Care (Post Inpatient/ED Visit) (Signed)
   04/14/2024  Name: Lori Brewer MRN: [id number] DOB: Feb 03, 1961  Today's TOC FU Call Status: Today's TOC FU Call Status:: Unsuccessful Call (3rd Attempt) Unsuccessful Call (1st Attempt) Date: 04/11/24 Unsuccessful Call (2nd Attempt) Date: 04/13/24 Unsuccessful Call (3rd Attempt) Date: 04/14/24  Attempted to reach the patient regarding the most recent Inpatient/ED visit.  Follow Up Plan: No further outreach attempts will be made at this time. We have been unable to contact the patient.  Brack Shaddock [place]  Primary Care & Sports Medicine at [facility] [place] CMA, AAMA [address]  [place] [address] Office [phone]  Fax: [phone]
